# Patient Record
Sex: Male | Born: 1955 | Race: White | Hispanic: No | Marital: Single
Health system: Midwestern US, Community
[De-identification: ages and names within clinical notes are randomized; demographics above are authoritative.]

---

## 2011-07-09 ENCOUNTER — Ambulatory Visit: Payer: Self-pay | Admitting: Internal Medicine

## 2011-08-01 ENCOUNTER — Inpatient Hospital Stay: Payer: Self-pay | Admitting: Internal Medicine

## 2011-08-01 LAB — CBC
HCT: 25.2 % — ABNORMAL LOW (ref 40.0–52.0)
HGB: 8.4 g/dL — ABNORMAL LOW (ref 13.0–18.0)
Platelet: 281 10*3/uL (ref 150–440)
RBC: 3.05 10*6/uL — ABNORMAL LOW (ref 4.40–5.90)
RDW: 16.5 % — ABNORMAL HIGH (ref 11.5–14.5)
WBC: 20 10*3/uL — ABNORMAL HIGH (ref 3.8–10.6)

## 2011-08-01 LAB — URINALYSIS, COMPLETE
Bacteria: NONE SEEN
Specific Gravity: 1.013 (ref 1.003–1.030)
Squamous Epithelial: NONE SEEN
WBC UR: <1 /HPF (ref 0–5)

## 2011-08-01 LAB — COMPREHENSIVE METABOLIC PANEL
Albumin: 3.7 g/dL (ref 3.4–5.0)
Alkaline Phosphatase: 43 U/L — ABNORMAL LOW (ref 50–136)
Anion Gap: 23 — ABNORMAL HIGH (ref 7–16)
BUN: 63 mg/dL — ABNORMAL HIGH (ref 7–18)
Calcium, Total: 8.4 mg/dL — ABNORMAL LOW (ref 8.5–10.1)
Chloride: 112 mmol/L — ABNORMAL HIGH (ref 98–107)
EGFR (African American): 39 — ABNORMAL LOW
EGFR (Non-African Amer.): 32 — ABNORMAL LOW
Osmolality: 316 (ref 275–301)
Potassium: 3.6 mmol/L (ref 3.5–5.1)
Sodium: 148 mmol/L — ABNORMAL HIGH (ref 136–145)
Total Protein: 7.7 g/dL (ref 6.4–8.2)

## 2011-08-01 LAB — SALICYLATE LEVEL: Salicylates, Serum: <1.7 mg/dL

## 2011-08-01 LAB — TROPONIN I: Troponin-I: <0.02 ng/mL

## 2011-08-02 LAB — CBC WITH DIFFERENTIAL/PLATELET
Basophil #: 0.1 10*3/uL (ref 0.0–0.1)
Basophil %: 0.3 %
Eosinophil #: 0.1 10*3/uL (ref 0.0–0.7)
Eosinophil %: 0.3 %
HCT: 21.1 % — ABNORMAL LOW (ref 40.0–52.0)
HGB: 7 g/dL — ABNORMAL LOW (ref 13.0–18.0)
Lymphocyte #: 4.2 10*3/uL — ABNORMAL HIGH (ref 1.0–3.6)
Lymphocyte %: 22.8 %
MCHC: 33.2 g/dL (ref 32.0–36.0)
MCV: 84 fL (ref 80–100)
Monocyte #: 1.3 10*3/uL — ABNORMAL HIGH (ref 0.0–0.7)
Monocyte %: 6.9 %
RBC: 2.52 10*6/uL — ABNORMAL LOW (ref 4.40–5.90)
RDW: 16.2 % — ABNORMAL HIGH (ref 11.5–14.5)

## 2011-08-02 LAB — IRON AND TIBC
Iron Bind.Cap.(Total): 342 ug/dL (ref 250–450)
Unbound Iron-Bind.Cap.: 297 ug/dL

## 2011-08-02 LAB — COMPREHENSIVE METABOLIC PANEL
Albumin: 3.5 g/dL (ref 3.4–5.0)
Alkaline Phosphatase: 43 U/L — ABNORMAL LOW (ref 50–136)
Anion Gap: 15 (ref 7–16)
BUN: 49 mg/dL — ABNORMAL HIGH (ref 7–18)
Chloride: 120 mmol/L — ABNORMAL HIGH (ref 98–107)
Creatinine: 1.19 mg/dL (ref 0.60–1.30)
EGFR (African American): >60
EGFR (Non-African Amer.): 50 — ABNORMAL LOW
Glucose: 131 mg/dL — ABNORMAL HIGH (ref 65–99)
Potassium: 4.1 mmol/L (ref 3.5–5.1)
SGPT (ALT): 19 U/L
Total Protein: 7.2 g/dL (ref 6.4–8.2)

## 2011-08-02 LAB — TSH: Thyroid Stimulating Horm: 1.3 u[IU]/mL

## 2011-08-02 LAB — SEDIMENTATION RATE: Erythrocyte Sed Rate: 37 mm/hr — ABNORMAL HIGH (ref 0–20)

## 2011-08-02 LAB — AMMONIA: Ammonia, Plasma: 47 mcmol/L — ABNORMAL HIGH (ref 11–32)

## 2011-08-03 LAB — CBC WITH DIFFERENTIAL/PLATELET
HCT: 22.6 % — ABNORMAL LOW (ref 40.0–52.0)
Lymphocyte %: 23.5 %
MCH: 28.3 pg (ref 26.0–34.0)
MCHC: 32.9 g/dL (ref 32.0–36.0)
MCV: 86 fL (ref 80–100)
Monocyte %: 5.6 %
RDW: 16.4 % — ABNORMAL HIGH (ref 11.5–14.5)

## 2011-08-03 LAB — BASIC METABOLIC PANEL
BUN: 23 mg/dL — ABNORMAL HIGH (ref 7–18)
Calcium, Total: 7.8 mg/dL — ABNORMAL LOW (ref 8.5–10.1)
Chloride: 127 mmol/L — ABNORMAL HIGH (ref 98–107)
Creatinine: 0.9 mg/dL (ref 0.60–1.30)
EGFR (African American): >60
EGFR (Non-African Amer.): >60
Potassium: 3.6 mmol/L (ref 3.5–5.1)
Sodium: 159 mmol/L — ABNORMAL HIGH (ref 136–145)

## 2011-08-03 LAB — URINE CULTURE

## 2011-08-04 LAB — COMPREHENSIVE METABOLIC PANEL
Anion Gap: 12 (ref 7–16)
Bilirubin,Total: 0.4 mg/dL (ref 0.2–1.0)
Calcium, Total: 7.5 mg/dL — ABNORMAL LOW (ref 8.5–10.1)
Chloride: 123 mmol/L — ABNORMAL HIGH (ref 98–107)
EGFR (African American): >60
Sodium: 156 mmol/L — ABNORMAL HIGH (ref 136–145)
Total Protein: 6.1 g/dL — ABNORMAL LOW (ref 6.4–8.2)

## 2011-08-04 LAB — CBC WITH DIFFERENTIAL/PLATELET
Basophil #: 0 10*3/uL (ref 0.0–0.1)
Basophil %: 0.6 %
Eosinophil %: 3.8 %
HCT: 19 % — ABNORMAL LOW (ref 40.0–52.0)
HGB: 6.2 g/dL — ABNORMAL LOW (ref 13.0–18.0)
MCHC: 32.8 g/dL (ref 32.0–36.0)
Monocyte #: 0.3 10*3/uL (ref 0.0–0.7)
Monocyte %: 4.7 %
Neutrophil #: 3.9 10*3/uL (ref 1.4–6.5)
Neutrophil %: 55.6 %
Platelet: 91 10*3/uL — ABNORMAL LOW (ref 150–440)
RBC: 2.2 10*6/uL — ABNORMAL LOW (ref 4.40–5.90)
RDW: 16.8 % — ABNORMAL HIGH (ref 11.5–14.5)
WBC: 7 10*3/uL (ref 3.8–10.6)

## 2011-08-05 LAB — PROTIME-INR: Prothrombin Time: 15.5 secs — ABNORMAL HIGH (ref 11.5–14.7)

## 2011-08-05 LAB — CBC WITH DIFFERENTIAL/PLATELET
Bands: 1 %
Basophil %: 0.6 %
Eosinophil %: 3.7 %
Eosinophil: 2 %
HCT: 22.4 % — ABNORMAL LOW (ref 40.0–52.0)
Lymphocyte %: 36.9 %
MCH: 28.9 pg (ref 26.0–34.0)
MCV: 87 fL (ref 80–100)
Monocyte #: 0.3 10*3/uL (ref 0.0–0.7)
Monocytes: 6 %
Neutrophil #: 2.7 10*3/uL (ref 1.4–6.5)
Platelet: 88 10*3/uL — ABNORMAL LOW (ref 150–440)
RDW: 16.2 % — ABNORMAL HIGH (ref 11.5–14.5)
Segmented Neutrophils: 52 %

## 2011-08-05 LAB — BASIC METABOLIC PANEL
BUN: 7 mg/dL (ref 7–18)
Chloride: 123 mmol/L — ABNORMAL HIGH (ref 98–107)
Creatinine: 0.83 mg/dL (ref 0.60–1.30)
EGFR (African American): >60
EGFR (Non-African Amer.): >60
Glucose: 91 mg/dL (ref 65–99)
Osmolality: 301 (ref 275–301)
Sodium: 153 mmol/L — ABNORMAL HIGH (ref 136–145)

## 2011-08-05 LAB — APTT: Activated PTT: 33.8 secs (ref 23.6–35.9)

## 2011-08-05 LAB — FIBRINOGEN: Fibrinogen: 291 mg/dL (ref 210–470)

## 2011-08-05 LAB — FIBRIN DEGRADATION PROD.(ARMC ONLY): Fibrin Degradation Prod.: <10 (ref 2.1–7.7)

## 2011-08-06 LAB — BASIC METABOLIC PANEL
Anion Gap: 9 (ref 7–16)
BUN: 5 mg/dL — ABNORMAL LOW (ref 7–18)
Calcium, Total: 7.4 mg/dL — ABNORMAL LOW (ref 8.5–10.1)
Chloride: 115 mmol/L — ABNORMAL HIGH (ref 98–107)
Co2: 22 mmol/L (ref 21–32)
EGFR (African American): >60
Glucose: 89 mg/dL (ref 65–99)

## 2011-08-06 LAB — CBC WITH DIFFERENTIAL/PLATELET
Basophil %: 0.4 %
Eosinophil #: 0.2 10*3/uL (ref 0.0–0.7)
HCT: 22.7 % — ABNORMAL LOW (ref 40.0–52.0)
HGB: 7.5 g/dL — ABNORMAL LOW (ref 13.0–18.0)
Lymphocyte #: 1.6 10*3/uL (ref 1.0–3.6)
Lymphocyte %: 39.9 %
MCHC: 33 g/dL (ref 32.0–36.0)
Monocyte #: 0.2 10*3/uL (ref 0.0–0.7)
Monocyte %: 5.6 %
Neutrophil #: 2 10*3/uL (ref 1.4–6.5)
RBC: 2.6 10*6/uL — ABNORMAL LOW (ref 4.40–5.90)
RDW: 16.6 % — ABNORMAL HIGH (ref 11.5–14.5)
WBC: 4 10*3/uL (ref 3.8–10.6)

## 2011-08-07 LAB — CBC WITH DIFFERENTIAL/PLATELET
Basophil #: 0 10*3/uL (ref 0.0–0.1)
Eosinophil #: 0.1 10*3/uL (ref 0.0–0.7)
Eosinophil %: 4.3 %
HCT: 24.7 % — ABNORMAL LOW (ref 40.0–52.0)
MCHC: 33.7 g/dL (ref 32.0–36.0)
MCV: 87 fL (ref 80–100)
Monocyte #: 0.2 10*3/uL (ref 0.0–0.7)
Monocyte %: 5.7 %
Neutrophil %: 49.8 %
RBC: 2.86 10*6/uL — ABNORMAL LOW (ref 4.40–5.90)
RDW: 17 % — ABNORMAL HIGH (ref 11.5–14.5)
WBC: 3.5 10*3/uL — ABNORMAL LOW (ref 3.8–10.6)

## 2011-08-07 LAB — BASIC METABOLIC PANEL
BUN: 5 mg/dL — ABNORMAL LOW (ref 7–18)
Calcium, Total: 7.8 mg/dL — ABNORMAL LOW (ref 8.5–10.1)
Chloride: 117 mmol/L — ABNORMAL HIGH (ref 98–107)
Co2: 24 mmol/L (ref 21–32)
Creatinine: 0.8 mg/dL (ref 0.60–1.30)
EGFR (African American): >60
EGFR (Non-African Amer.): >60
Glucose: 87 mg/dL (ref 65–99)
Potassium: 3.6 mmol/L (ref 3.5–5.1)
Sodium: 150 mmol/L — ABNORMAL HIGH (ref 136–145)

## 2011-08-07 LAB — CULTURE, BLOOD (SINGLE)

## 2011-08-08 LAB — CBC WITH DIFFERENTIAL/PLATELET
Basophil #: 0 10*3/uL (ref 0.0–0.1)
Basophil %: 0.2 %
Eosinophil #: 0.2 10*3/uL (ref 0.0–0.7)
Eosinophil %: 4.9 %
HCT: 25.3 % — ABNORMAL LOW (ref 40.0–52.0)
HGB: 8.4 g/dL — ABNORMAL LOW (ref 13.0–18.0)
Lymphocyte #: 1.2 10*3/uL (ref 1.0–3.6)
MCH: 28.9 pg (ref 26.0–34.0)
MCV: 87 fL (ref 80–100)
Monocyte #: 0.2 10*3/uL (ref 0.0–0.7)
Monocyte %: 5.5 %
RDW: 16.4 % — ABNORMAL HIGH (ref 11.5–14.5)
WBC: 3.5 10*3/uL — ABNORMAL LOW (ref 3.8–10.6)

## 2011-08-08 LAB — BASIC METABOLIC PANEL
Anion Gap: 10 (ref 7–16)
Chloride: 115 mmol/L — ABNORMAL HIGH (ref 98–107)
Co2: 24 mmol/L (ref 21–32)
Creatinine: 0.82 mg/dL (ref 0.60–1.30)
Glucose: 85 mg/dL (ref 65–99)
Osmolality: 292 (ref 275–301)
Potassium: 3.6 mmol/L (ref 3.5–5.1)
Sodium: 149 mmol/L — ABNORMAL HIGH (ref 136–145)

## 2011-08-09 ENCOUNTER — Ambulatory Visit: Payer: Self-pay | Admitting: Internal Medicine

## 2011-08-23 ENCOUNTER — Ambulatory Visit: Payer: Self-pay | Admitting: Internal Medicine

## 2011-08-23 LAB — CBC CANCER CENTER
Basophil #: 0 x10 3/mm (ref 0.0–0.1)
Eosinophil #: 0.2 x10 3/mm (ref 0.0–0.7)
Eosinophil %: 6 %
HCT: 29.4 % — ABNORMAL LOW (ref 40.0–52.0)
HGB: 9.5 g/dL — ABNORMAL LOW (ref 13.0–18.0)
Lymphocyte #: 1.1 x10 3/mm (ref 1.0–3.6)
Lymphocyte %: 38.8 %
MCH: 28.1 pg (ref 26.0–34.0)
MCV: 87 fL (ref 80–100)
Monocyte #: 0.2 x10 3/mm (ref 0.2–1.0)
Neutrophil #: 1.4 x10 3/mm (ref 1.4–6.5)
RBC: 3.36 10*6/uL — ABNORMAL LOW (ref 4.40–5.90)
RDW: 17.7 % — ABNORMAL HIGH (ref 11.5–14.5)
WBC: 2.9 x10 3/mm — ABNORMAL LOW (ref 3.8–10.6)

## 2011-09-01 LAB — CBC CANCER CENTER
Bands: 1 %
Basophil #: 0 x10 3/mm (ref 0.0–0.1)
Eosinophil #: 0.2 x10 3/mm (ref 0.0–0.7)
HCT: 30.5 % — ABNORMAL LOW (ref 40.0–52.0)
HGB: 9.8 g/dL — ABNORMAL LOW (ref 13.0–18.0)
Lymphocyte #: 1.5 x10 3/mm (ref 1.0–3.6)
Lymphocyte %: 33.2 %
MCH: 27.6 pg (ref 26.0–34.0)
MCHC: 32.2 g/dL (ref 32.0–36.0)
MCV: 86 fL (ref 80–100)
Monocyte #: 0.3 x10 3/mm (ref 0.2–1.0)
Neutrophil #: 2.4 x10 3/mm (ref 1.4–6.5)
Platelet: 84 x10 3/mm — ABNORMAL LOW (ref 150–440)
RBC: 3.56 10*6/uL — ABNORMAL LOW (ref 4.40–5.90)
Segmented Neutrophils: 52 %

## 2011-09-01 LAB — BASIC METABOLIC PANEL
Anion Gap: 9 (ref 7–16)
BUN: 9 mg/dL (ref 7–18)
Calcium, Total: 8.2 mg/dL — ABNORMAL LOW (ref 8.5–10.1)
EGFR (African American): >60
EGFR (Non-African Amer.): >60
Glucose: 111 mg/dL — ABNORMAL HIGH (ref 65–99)
Osmolality: 283 (ref 275–301)

## 2011-09-02 LAB — CBC WITH DIFFERENTIAL/PLATELET
Basophil #: 0.1 10*3/uL (ref 0.0–0.1)
Basophil %: 1.1 %
Eosinophil #: 0.3 10*3/uL (ref 0.0–0.7)
Eosinophil %: 4 %
HCT: 24.9 % — ABNORMAL LOW (ref 40.0–52.0)
Lymphocyte %: 35.5 %
MCH: 28.7 pg (ref 26.0–34.0)
MCHC: 33.4 g/dL (ref 32.0–36.0)
MCV: 86 fL (ref 80–100)
Monocyte #: 0.4 x10 3/mm (ref 0.2–1.0)
Monocyte %: 6 %
Neutrophil #: 3.7 10*3/uL (ref 1.4–6.5)
Neutrophil %: 53.4 %
Platelet: 99 10*3/uL — ABNORMAL LOW (ref 150–440)
RBC: 2.9 10*6/uL — ABNORMAL LOW (ref 4.40–5.90)
RDW: 16.8 % — ABNORMAL HIGH (ref 11.5–14.5)

## 2011-09-02 LAB — URINALYSIS, COMPLETE
Bilirubin,UR: NEGATIVE
Glucose,UR: NEGATIVE mg/dL (ref 0–75)
Ketone: NEGATIVE
Leukocyte Esterase: NEGATIVE
Nitrite: NEGATIVE
Ph: 5 (ref 4.5–8.0)
Protein: NEGATIVE
WBC UR: NONE SEEN /HPF (ref 0–5)

## 2011-09-02 LAB — COMPREHENSIVE METABOLIC PANEL
Albumin: 3 g/dL — ABNORMAL LOW (ref 3.4–5.0)
Anion Gap: 13 (ref 7–16)
Bilirubin,Total: 0.7 mg/dL (ref 0.2–1.0)
Chloride: 111 mmol/L — ABNORMAL HIGH (ref 98–107)
Co2: 19 mmol/L — ABNORMAL LOW (ref 21–32)
EGFR (African American): >60
Glucose: 93 mg/dL (ref 65–99)
Osmolality: 291 (ref 275–301)
Potassium: 3.6 mmol/L (ref 3.5–5.1)
SGOT(AST): 25 U/L (ref 15–37)
SGPT (ALT): 15 U/L
Sodium: 143 mmol/L (ref 136–145)
Total Protein: 7 g/dL (ref 6.4–8.2)

## 2011-09-02 LAB — PROT IMMUNOELECTROPHORES(ARMC)

## 2011-09-02 LAB — TROPONIN I: Troponin-I: <0.02 ng/mL

## 2011-09-03 ENCOUNTER — Inpatient Hospital Stay: Payer: Self-pay | Admitting: Internal Medicine

## 2011-09-03 LAB — HEMOGLOBIN
HGB: 8.3 g/dL — ABNORMAL LOW (ref 13.0–18.0)
HGB: 8.7 g/dL — ABNORMAL LOW (ref 13.0–18.0)

## 2011-09-04 LAB — CBC WITH DIFFERENTIAL/PLATELET
Basophil %: 1 %
Eosinophil #: 0.1 10*3/uL (ref 0.0–0.7)
Eosinophil %: 5.1 %
HCT: 23.2 % — ABNORMAL LOW (ref 40.0–52.0)
Lymphocyte #: 1.2 10*3/uL (ref 1.0–3.6)
Lymphocyte %: 42.4 %
MCH: 29 pg (ref 26.0–34.0)
MCHC: 34.5 g/dL (ref 32.0–36.0)
MCV: 84 fL (ref 80–100)
Monocyte #: 0.2 x10 3/mm (ref 0.2–1.0)
Platelet: 62 10*3/uL — ABNORMAL LOW (ref 150–440)
RDW: 15.6 % — ABNORMAL HIGH (ref 11.5–14.5)
WBC: 2.9 10*3/uL — ABNORMAL LOW (ref 3.8–10.6)

## 2011-09-04 LAB — PROTIME-INR
INR: 1.2
Prothrombin Time: 15.9 secs — ABNORMAL HIGH (ref 11.5–14.7)

## 2011-09-04 LAB — COMPREHENSIVE METABOLIC PANEL
Albumin: 3.1 g/dL — ABNORMAL LOW (ref 3.4–5.0)
Alkaline Phosphatase: 54 U/L (ref 50–136)
Calcium, Total: 7.7 mg/dL — ABNORMAL LOW (ref 8.5–10.1)
Chloride: 111 mmol/L — ABNORMAL HIGH (ref 98–107)
Creatinine: 0.77 mg/dL (ref 0.60–1.30)
EGFR (Non-African Amer.): >60
Potassium: 3.3 mmol/L — ABNORMAL LOW (ref 3.5–5.1)
SGOT(AST): 26 U/L (ref 15–37)
SGPT (ALT): 15 U/L
Sodium: 143 mmol/L (ref 136–145)
Total Protein: 6.6 g/dL (ref 6.4–8.2)

## 2011-09-05 LAB — CBC WITH DIFFERENTIAL/PLATELET
Basophil #: 0 10*3/uL (ref 0.0–0.1)
Eosinophil #: 0.1 10*3/uL (ref 0.0–0.7)
Eosinophil %: 4.8 %
HCT: 27 % — ABNORMAL LOW (ref 40.0–52.0)
HGB: 9.2 g/dL — ABNORMAL LOW (ref 13.0–18.0)
MCHC: 34.2 g/dL (ref 32.0–36.0)
MCV: 84 fL (ref 80–100)
Monocyte #: 0.1 x10 3/mm — ABNORMAL LOW (ref 0.2–1.0)
Monocyte %: 6.3 %
RBC: 3.22 10*6/uL — ABNORMAL LOW (ref 4.40–5.90)
RDW: 16.3 % — ABNORMAL HIGH (ref 11.5–14.5)
WBC: 2.3 10*3/uL — ABNORMAL LOW (ref 3.8–10.6)

## 2011-09-05 LAB — BASIC METABOLIC PANEL
Anion Gap: 8 (ref 7–16)
BUN: 7 mg/dL (ref 7–18)
Calcium, Total: 7.9 mg/dL — ABNORMAL LOW (ref 8.5–10.1)
Chloride: 113 mmol/L — ABNORMAL HIGH (ref 98–107)
Creatinine: 0.75 mg/dL (ref 0.60–1.30)
EGFR (Non-African Amer.): >60
Osmolality: 284 (ref 275–301)
Potassium: 3.5 mmol/L (ref 3.5–5.1)

## 2011-09-06 LAB — BASIC METABOLIC PANEL
Anion Gap: 11 (ref 7–16)
Calcium, Total: 7.6 mg/dL — ABNORMAL LOW (ref 8.5–10.1)
Chloride: 110 mmol/L — ABNORMAL HIGH (ref 98–107)
Co2: 21 mmol/L (ref 21–32)
EGFR (African American): >60
EGFR (Non-African Amer.): >60
Glucose: 89 mg/dL (ref 65–99)
Osmolality: 280 (ref 275–301)
Sodium: 142 mmol/L (ref 136–145)

## 2011-09-06 LAB — CBC WITH DIFFERENTIAL/PLATELET
Basophil %: 0.6 %
Eosinophil %: 4.2 %
HGB: 9.1 g/dL — ABNORMAL LOW (ref 13.0–18.0)
Lymphocyte %: 39.5 %
MCH: 28.8 pg (ref 26.0–34.0)
MCHC: 34 g/dL (ref 32.0–36.0)
MCV: 85 fL (ref 80–100)
Monocyte #: 0.2 x10 3/mm (ref 0.2–1.0)
Monocyte %: 6.6 %
Neutrophil #: 1.3 10*3/uL — ABNORMAL LOW (ref 1.4–6.5)
Neutrophil %: 49.1 %
Platelet: 68 10*3/uL — ABNORMAL LOW (ref 150–440)
RDW: 16.1 % — ABNORMAL HIGH (ref 11.5–14.5)
WBC: 2.7 10*3/uL — ABNORMAL LOW (ref 3.8–10.6)

## 2011-09-07 LAB — CBC WITH DIFFERENTIAL/PLATELET
Basophil #: 0 10*3/uL (ref 0.0–0.1)
Eosinophil #: 0.2 10*3/uL (ref 0.0–0.7)
Eosinophil %: 5.3 %
HCT: 26.5 % — ABNORMAL LOW (ref 40.0–52.0)
HGB: 9.1 g/dL — ABNORMAL LOW (ref 13.0–18.0)
Lymphocyte #: 1.2 10*3/uL (ref 1.0–3.6)
Lymphocyte %: 36.3 %
MCHC: 34.5 g/dL (ref 32.0–36.0)
Monocyte %: 5.8 %
Neutrophil #: 1.7 10*3/uL (ref 1.4–6.5)
Platelet: 78 10*3/uL — ABNORMAL LOW (ref 150–440)
RBC: 3.14 10*6/uL — ABNORMAL LOW (ref 4.40–5.90)
RDW: 16.1 % — ABNORMAL HIGH (ref 11.5–14.5)
WBC: 3.2 10*3/uL — ABNORMAL LOW (ref 3.8–10.6)

## 2011-09-08 ENCOUNTER — Ambulatory Visit: Payer: Self-pay | Admitting: Internal Medicine

## 2011-10-09 ENCOUNTER — Ambulatory Visit: Payer: Self-pay | Admitting: Internal Medicine

## 2011-10-14 ENCOUNTER — Ambulatory Visit: Payer: Self-pay | Admitting: Internal Medicine

## 2011-11-01 ENCOUNTER — Ambulatory Visit: Payer: Self-pay | Admitting: Unknown Physician Specialty

## 2011-11-01 LAB — PROTIME-INR
INR: 1.3
Prothrombin Time: 16.3 secs — ABNORMAL HIGH (ref 11.5–14.7)

## 2011-11-01 LAB — PLATELET COUNT: Platelet: 84 10*3/uL — ABNORMAL LOW (ref 150–440)

## 2011-11-01 LAB — APTT: Activated PTT: 39.5 secs — ABNORMAL HIGH (ref 23.6–35.9)

## 2011-11-19 ENCOUNTER — Emergency Department: Payer: Self-pay | Admitting: Emergency Medicine

## 2011-11-19 LAB — BASIC METABOLIC PANEL
Anion Gap: 9 (ref 7–16)
BUN: 8 mg/dL (ref 7–18)
Calcium, Total: 8.9 mg/dL (ref 8.5–10.1)
Chloride: 106 mmol/L (ref 98–107)
Creatinine: 0.86 mg/dL (ref 0.60–1.30)
EGFR (African American): >60
Glucose: 91 mg/dL (ref 65–99)
Osmolality: 277 (ref 275–301)
Potassium: 3.6 mmol/L (ref 3.5–5.1)
Sodium: 140 mmol/L (ref 136–145)

## 2011-11-19 LAB — CBC WITH DIFFERENTIAL/PLATELET
Basophil %: 0.6 %
Eosinophil #: 0.2 10*3/uL (ref 0.0–0.7)
Eosinophil %: 3 %
HGB: 11.9 g/dL — ABNORMAL LOW (ref 13.0–18.0)
Lymphocyte #: 1 10*3/uL (ref 1.0–3.6)
MCH: 28.6 pg (ref 26.0–34.0)
MCV: 83 fL (ref 80–100)
Monocyte #: 0.3 x10 3/mm (ref 0.2–1.0)
Monocyte %: 6.2 %
Neutrophil %: 71.3 %
RBC: 4.15 10*6/uL — ABNORMAL LOW (ref 4.40–5.90)
RDW: 15.9 % — ABNORMAL HIGH (ref 11.5–14.5)
WBC: 5 10*3/uL (ref 3.8–10.6)

## 2011-11-21 LAB — BETA STREP CULTURE(ARMC)

## 2012-05-21 ENCOUNTER — Ambulatory Visit: Payer: Self-pay | Admitting: Internal Medicine

## 2012-06-15 ENCOUNTER — Ambulatory Visit: Payer: Self-pay | Admitting: Internal Medicine

## 2013-06-22 IMAGING — CT CT HEAD WITHOUT CONTRAST
2 of 3 series · 16 of 30 positions shown, 19 images · non-contrast
Comparison: none

REASON FOR EXAM: ams
COMMENTS:

[Series 2: without · axial · non-contrast · 0.45mm/px · z∈[+247,+377]mm · 9 of 34 slices shown, 12 images (1 of 2)]
[im 4/34  brain]
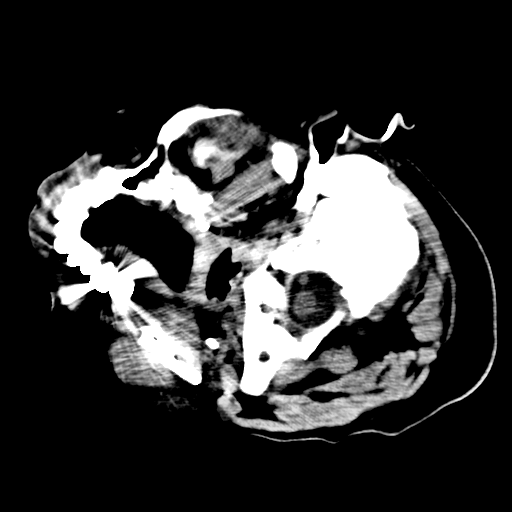
[im 4/34  bone]
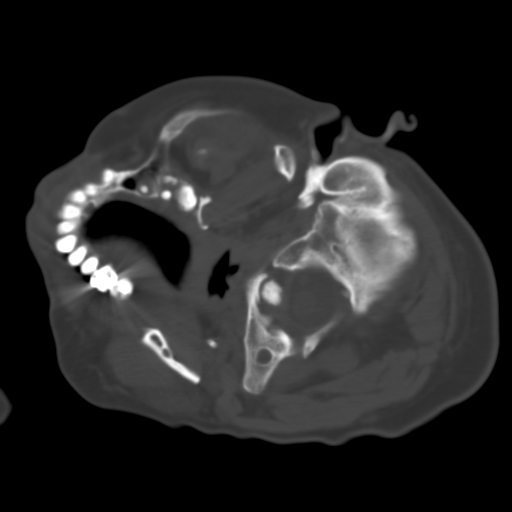
[im 7/34  brain]
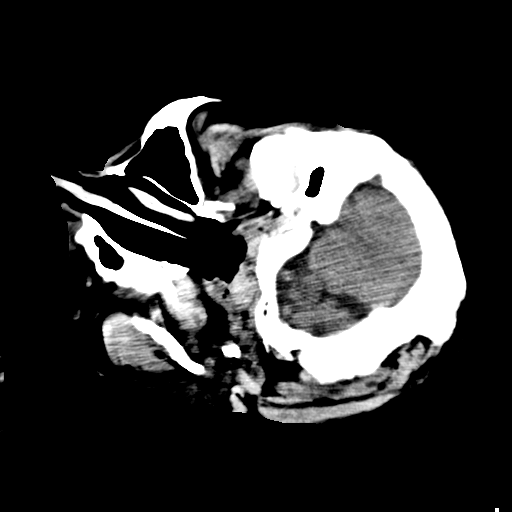
[im 10/34  brain]
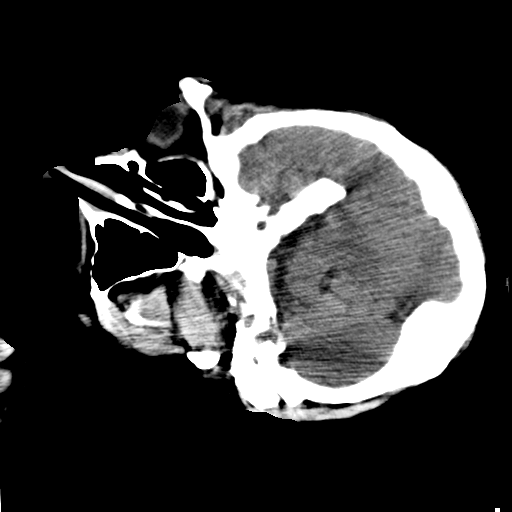
[im 14/34  brain]
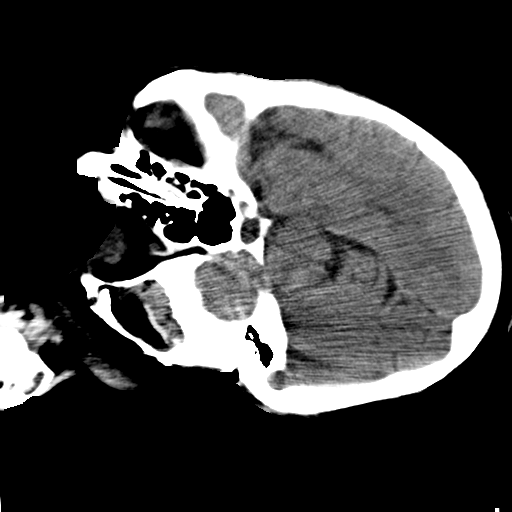
[im 17/34  brain]
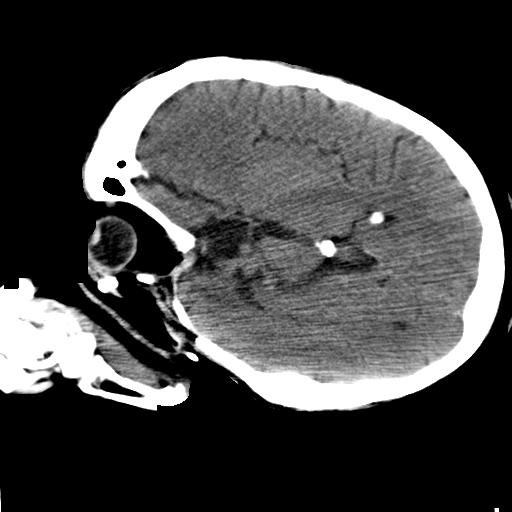
[im 17/34  bone]
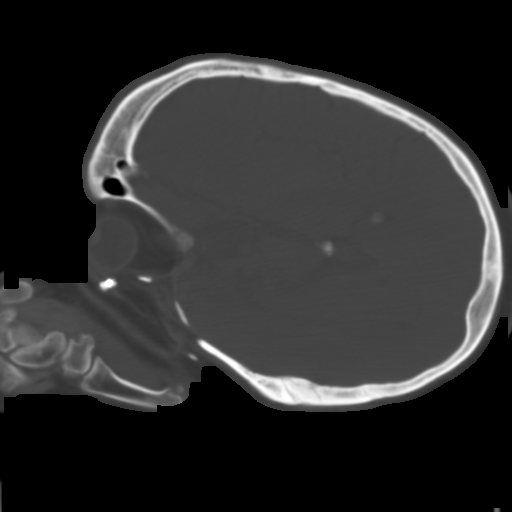
[im 20/34  brain]
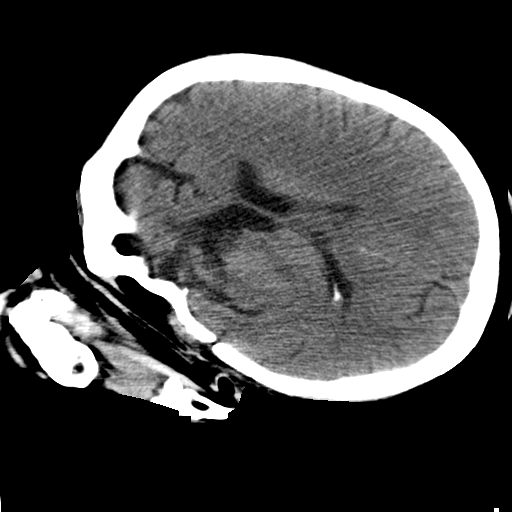
[im 24/34  brain]
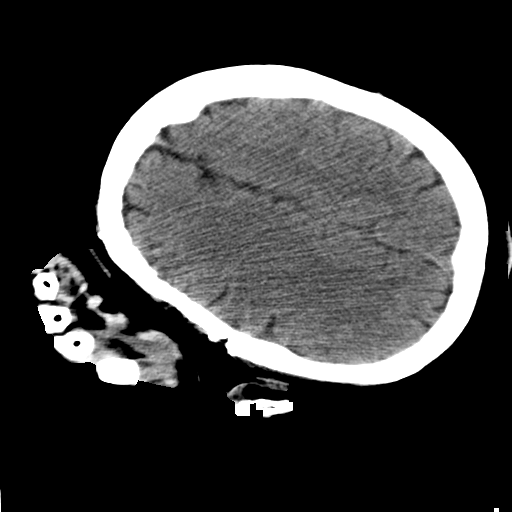
[im 27/34  brain]
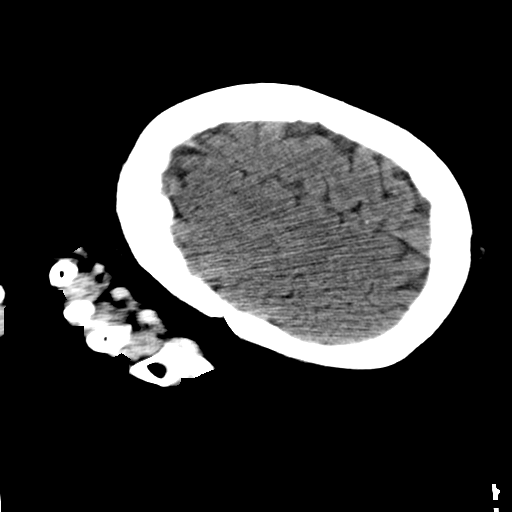
[im 30/34  brain]
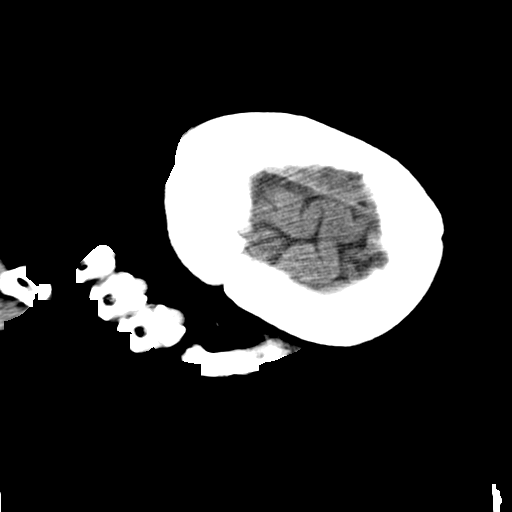
[im 30/34  bone]
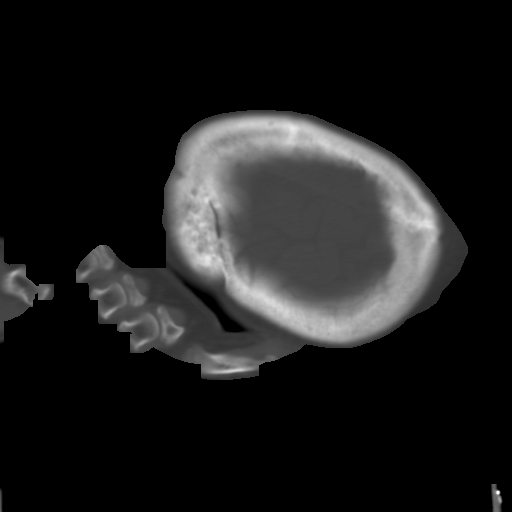

[Series 4: without · axial · non-contrast · 0.45mm/px · z∈[+306,+414]mm · 7 of 31 slices shown (2 of 2)]
[im 4/31  brain]
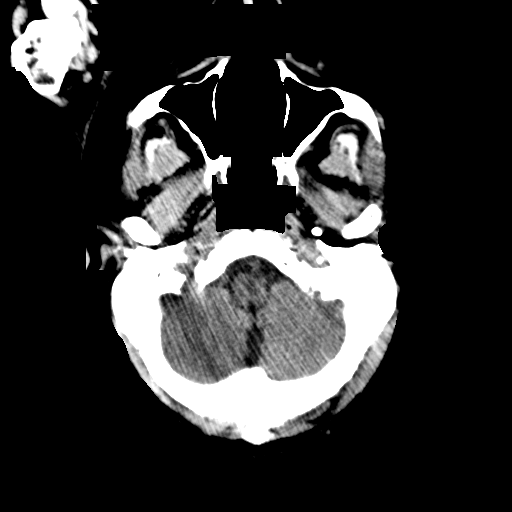
[im 8/31  brain]
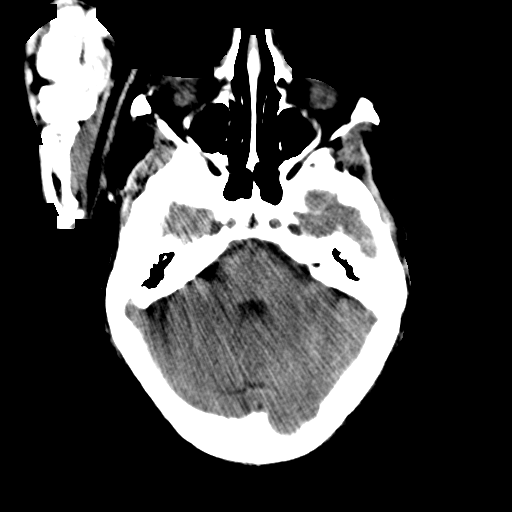
[im 12/31  brain]
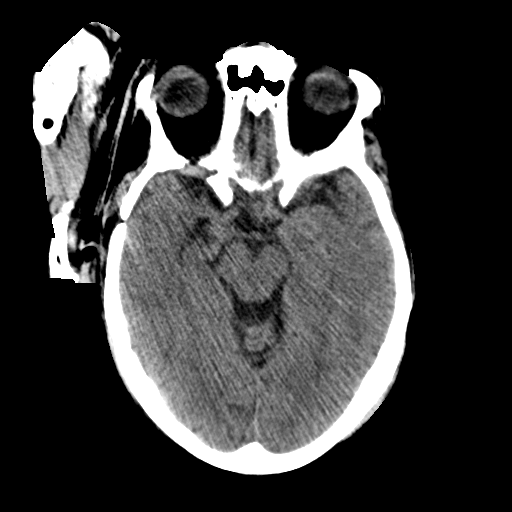
[im 16/31  brain]
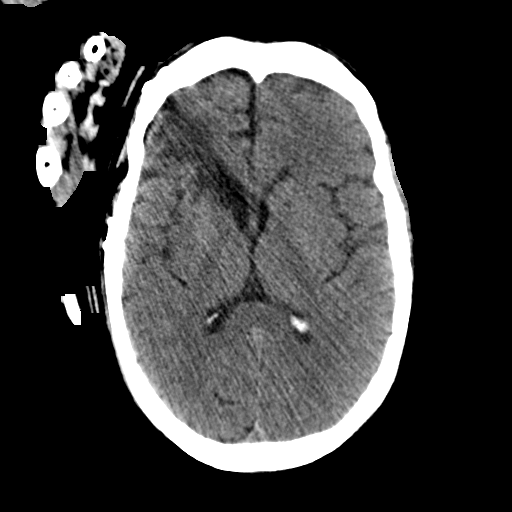
[im 19/31  brain]
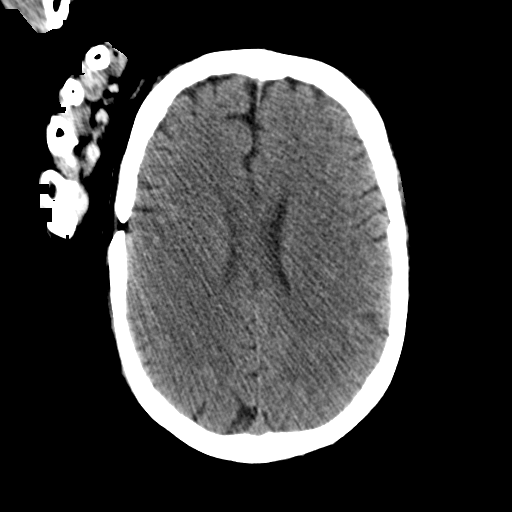
[im 23/31  brain]
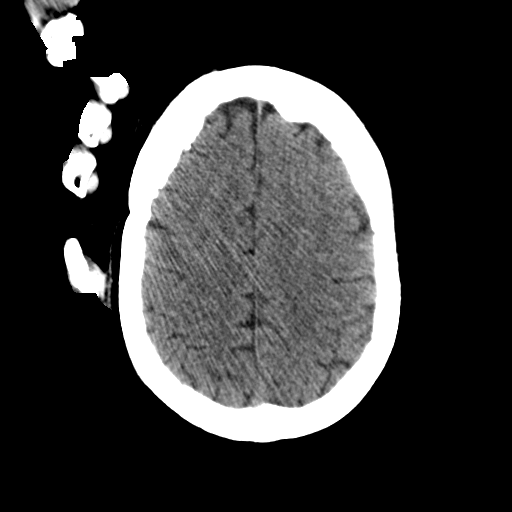
[im 27/31  brain]
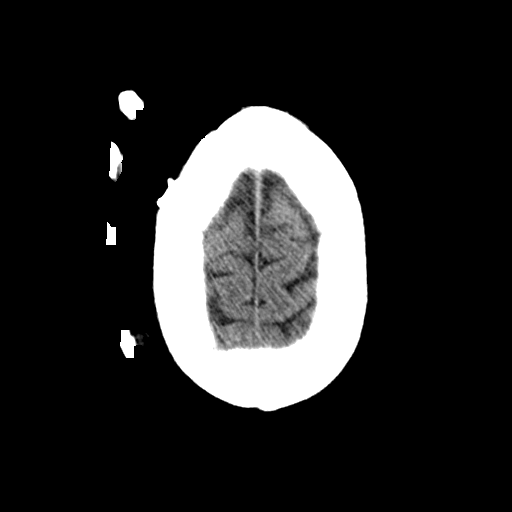

[16 of 30 positions shown; findings below may reference images not displayed]

PROCEDURE:     CT  - CT HEAD WITHOUT CONTRAST  - August 01, 2011  [DATE]

RESULT:     Axial imaging was performed through the brain with the patient's
head turned toward the right. Reconstructions were obtained to allow the
patient to be appropriately positioned.

The ventricles are normal in size and position. There is encephalomalacia
involving the anteromedial aspect of the right temporal lobe and the
inferior aspect of the right frontal lobe. There is no shift of the midline.
There is mild ex vacuo dilation the frontal horn of the right lateral
ventricle. There is no evidence of an acute intracranial hemorrhage nor of
an acute evolving ischemic infarction. The cerebellum and brainstem are
grossly normal.

At bone window settings the observed portions of the paranasal sinuses and
mastoid air cells are clear. There is evidence of previous craniectomy in
the right frontal and temporal regions.
IMPRESSION: 1. I do not see evidence of an acute ischemic or hemorrhagic event. There is
no intracranial mass effect nor hydrocephalus.
2. There are chronic changes in the right frontal and temporal lobes and
evidence of previous surgery in this region.

## 2013-06-22 IMAGING — CR DG CHEST 1V PORT
1 series · 1 of 1 positions shown · non-contrast
Comparison: none

REASON FOR EXAM: ams
COMMENTS:

[portable]
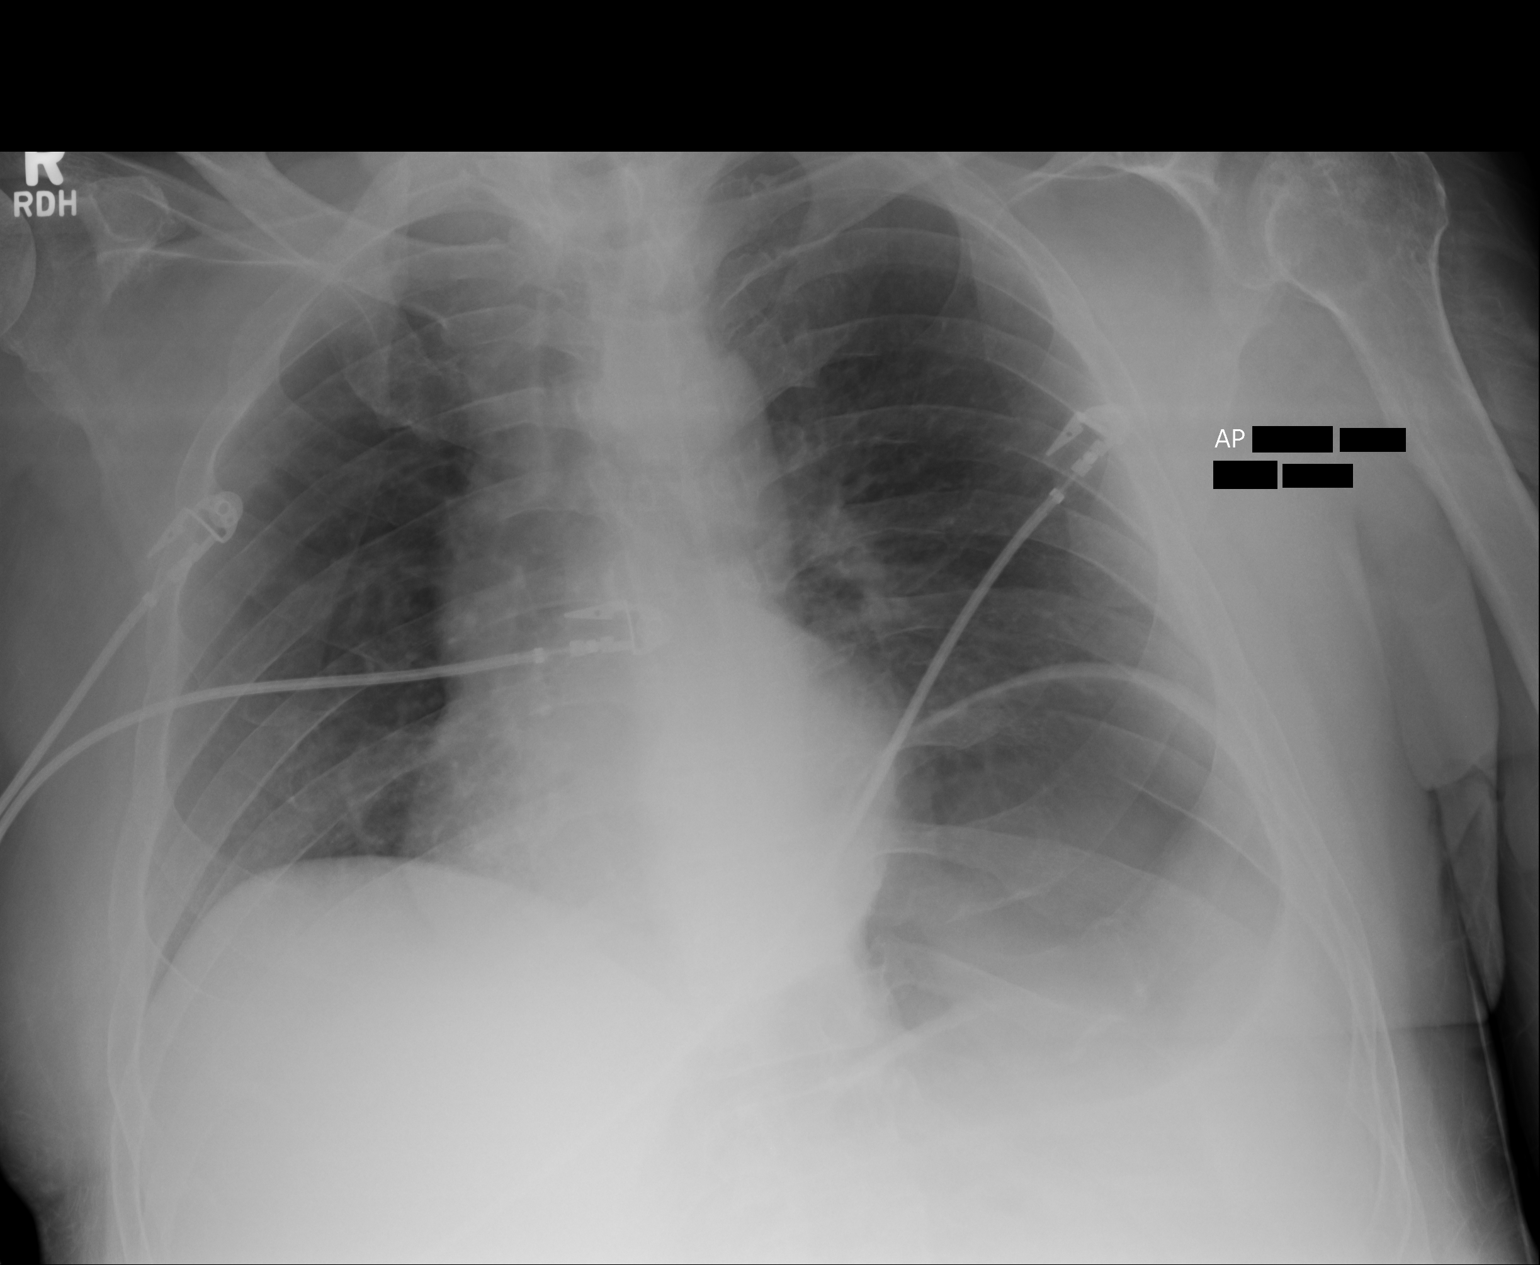

[1 of 1 positions shown; findings below may reference images not displayed]

PROCEDURE:     DXR - DXR PORTABLE CHEST SINGLE VIEW  - August 01, 2011  [DATE]

RESULT:     The lung fields are clear. No pneumonia, pneumothorax or pleural
effusion is seen. Heart size is normal. Monitoring electrodes are present.
Incidental note is made of moderate gaseous distention of the stomach.
IMPRESSION: 1.     No acute changes are identified.

## 2013-11-14 ENCOUNTER — Ambulatory Visit: Payer: Self-pay | Admitting: Internal Medicine

## 2013-11-22 ENCOUNTER — Ambulatory Visit: Payer: Self-pay | Admitting: Internal Medicine

## 2013-11-22 LAB — CBC CANCER CENTER
Comment - H1-Com2: NORMAL
Eosinophil: 2 %
HCT: 34.5 % — ABNORMAL LOW (ref 40.0–52.0)
HGB: 11.4 g/dL — AB (ref 13.0–18.0)
LYMPHS PCT: 25 %
MCH: 28.8 pg (ref 26.0–34.0)
MCHC: 33 g/dL (ref 32.0–36.0)
MCV: 87 fL (ref 80–100)
Monocytes: 5 %
Platelet: 101 x10 3/mm — ABNORMAL LOW (ref 150–440)
RBC: 3.96 10*6/uL — ABNORMAL LOW (ref 4.40–5.90)
RDW: 15.2 % — AB (ref 11.5–14.5)
Segmented Neutrophils: 68 %
WBC: 5 x10 3/mm (ref 3.8–10.6)

## 2013-11-22 LAB — IRON AND TIBC
IRON BIND. CAP.(TOTAL): 423 ug/dL (ref 250–450)
Iron Saturation: 12 %
Iron: 50 ug/dL — ABNORMAL LOW (ref 65–175)
Unbound Iron-Bind.Cap.: 373 ug/dL

## 2013-11-22 LAB — PROTIME-INR
INR: 1.2
Prothrombin Time: 14.8 secs — ABNORMAL HIGH (ref 11.5–14.7)

## 2013-11-22 LAB — FERRITIN: Ferritin (ARMC): 32 ng/mL (ref 8–388)

## 2013-11-22 LAB — LACTATE DEHYDROGENASE: LDH: 182 U/L (ref 85–241)

## 2013-11-22 LAB — RETICULOCYTES
ABSOLUTE RETIC COUNT: 0.0777 10*6/uL (ref 0.019–0.186)
Reticulocyte: 1.96 % (ref 0.4–3.1)

## 2013-11-22 LAB — APTT: Activated PTT: 35.2 secs (ref 23.6–35.9)

## 2013-11-22 LAB — FIBRINOGEN: FIBRINOGEN: 311 mg/dL (ref 210–470)

## 2013-11-22 LAB — FOLATE: FOLIC ACID: 39.5 ng/mL (ref 3.1–100.0)

## 2013-11-26 LAB — PROT IMMUNOELECTROPHORES(ARMC)

## 2013-12-08 ENCOUNTER — Ambulatory Visit: Payer: Self-pay | Admitting: Internal Medicine

## 2014-01-21 ENCOUNTER — Ambulatory Visit: Payer: Self-pay | Admitting: Unknown Physician Specialty

## 2014-09-01 NOTE — Consult Note (Signed)
Chief Complaint:   Subjective/Chief Complaint Patient seen and examined, please see full GI consult.  Patient admitted with ams.   Noted with anemia, "large black bm" yesterday.  Pateitn with elevated ammonia. Elevated ammonia could be related to this.  Currently mental status is improved, patient appropriate and answering questions.  H/O GERD 25 years ago, not om antireflux meds recently, minimal nsaid use.   Currently with drop of platelets, Hematology consulted and following, question of hits versus antibiotic effect versus consumptive, versus multifactorial.  Agree with EGDand colonoscopy when clinically fe4asible, awaiting improvement of platelets at this point.  I will not be available until monday,  Dr Mechele CollinElliott will be following over the weekend.  Recommend EGD on monday followed by colonoscopy the next day or as outpatient once the stomach is verified to have no bleeding lesion that could be exacerbated by large colon prep.   VITAL SIGNS/ANCILLARY NOTES: **Vital Signs.:   28-Mar-13 17:04   Vital Signs Type Q 4hr   Temperature Temperature (F) 97.2   Celsius 36.2   Temperature Source axillary   Pulse Pulse 65   Respirations Respirations 20   Systolic BP Systolic BP 110   Diastolic BP (mmHg) Diastolic BP (mmHg) 66   Mean BP 80   Pulse Ox % Pulse Ox % 95   Pulse Ox Activity Level  At rest   Oxygen Delivery Room Air/ 21 %   Electronic Signatures: Barnetta ChapelSkulskie, Martin (MD)  (Signed 28-Mar-13 18:50)  Authored: Chief Complaint, VITAL SIGNS/ANCILLARY NOTES   Last Updated: 28-Mar-13 18:50 by Barnetta ChapelSkulskie, Martin (MD)

## 2014-09-01 NOTE — Consult Note (Signed)
PATIENT NAME:  Duane Young, COLLEGE MR#:  [id number] DATE OF BIRTH:  [birth year]  DATE OF CONSULTATION:  09/03/2011  REFERRING PHYSICIAN:  Alounthith Phichith, MD CONSULTING PHYSICIAN:  Nimrit Kehres R. Ma Hillock, MD  REASON FOR CONSULTATION: Acute on chronic anemia status post bone marrow biopsy on 09/02/2011.  HISTORY OF PRESENT ILLNESS: The patient is a 59 year old gentleman with past medical history significant for brain tumor resected in [place], [place] (according to history provided by patient and mother, this was about 10 years ago and he had surgery and radiation, unsure of histology but states he has not had recurrence since then) who recently had been hospitalized for weakness and anemia. He also was found to have thrombocytopenia. Hematology consult was done at that time and work-up did not reveal obvious etiology other than splenomegaly on ultrasound, mild iron deficiency with serum iron of 45 and iron saturation 13%, serum ferritin was in normal range at 19. B12 and folate were unremarkable. The patient does not have any renal insufficiency. The patient therefore was seen for follow-up on 09/01/2011 at which time hemoglobin was 9.8, platelet count 84,000, and bone marrow biopsy was done to rule out underlying myelodysplasia or other marrow disorder like lymphoma given splenomegaly. The patient was admitted to the hospital earlier today for complaints of progressive weakness and hemoglobin had dropped down to 8.3, he is getting 2 units packed red blood cell transfusion. Clinically beginning to feel better.   PAST MEDICAL HISTORY/PAST SURGICAL HISTORY: As in history of present illness. In addition: 1. Anemia of chronic disease and pancytopenia.  2. Chronic constipation.  3. SIADH by history.   FAMILY HISTORY: Noncontributory.   SOCIAL HISTORY: Denies history of smoking, alcohol, or recreational drug usage. He resides in a group home, currently has been staying with his mother for the past few days.    ALLERGIES: No known drug allergies.    MEDICATIONS: 1. Seroquel 25 mg in the a.m. p.r.n. 2. Lactulose 30 mL every six hours p.r.n. 3. Nadolol 20 mg daily.  4. Protonix 40 mg daily. 5. Ferrous sulfate 325 mg twice a day. 6. Senna 1 tablet twice a day. 7. Tylenol 650 mg every four hours p.r.n.   REVIEW OF SYSTEMS: CONSTITUTIONAL: Generalized weakness and fatigue. Denies fevers or chills. No night sweats. HEENT: No new headaches, dizziness, epistaxis, ear or jaw pain. CARDIAC: No angina, orthopnea, palpitations, or paroxysmal nocturnal dyspnea. LUNGS: Mild dyspnea on exertion only. No cough, sputum, hemoptysis, or chest pain. GASTROINTESTINAL: No nausea or vomiting. No bright red blood in stools or melena. GENITOURINARY: No dysuria or hematuria. MUSCULOSKELETAL: No new bone pains. Has chronic back pain which is mild and unchanged. SKIN: No new rashes or pruritus. HEMATOLOGIC: Denies obvious bleeding symptoms including blood in stools or urine. NEUROLOGIC: No new focal weakness, seizures, or loss of consciousness. ENDOCRINE: No polyuria or polydipsia. Appetite is steady.   PHYSICAL EXAMINATION:   GENERAL: The patient is a moderately built well-nourished individual, sitting in recliner chair,  alert and oriented to self, place, person, and time and converses appropriately. No icterus. Pallor present.   VITAL SIGNS: 97.9, 76, respirations 18, 95/60, 95% on room air.   HEENT: Normocephalic, atraumatic. Extraocular movements intact. Sclera anicteric. No oral thrush.   NECK: Supple without lymphadenopathy.   HEART: S1 and S2 regular rate and rhythm.   LUNGS: Bilateral good air entry, no crepitations or rhonchi.   ABDOMEN: Soft and nontender, spleen just palpable.   EXTREMITIES: No major edema or cyanosis.   SKIN: No generalized  rashes or major bruising.   NEUROLOGIC: Cranial nerves are intact, moves all extremities spontaneously.   MUSCULOSKELETAL: No obvious joint deformity or  swelling.   LAB RESULTS: Hemoglobin 8.3, platelets 99, MCV 86, WBC 7000, and ANC 3700. Creatinine 0.73 and calcium 7.7. Liver functions unremarkable, except albumin of 3.0.   IMPRESSION AND RECOMMENDATIONS: A 59 year old gentleman who has been having chronic anemia along with recently diagnosed progressive thrombocytopenia and splenomegaly of untreated etiology (underlying occult chronic liver disease with splenomegaly/hypersplenism versus other etiology) who just had bone marrow biopsy on 09/01/2011 at which time hemoglobin was 9.8. He has been admitted with worsening anemia with hemoglobin closer to 8 grams and progressive fatigue. The patient is beginning to feel better after packed red blood cell transfusion support. Recommend continued supportive transfusion as indicated by labs and symptoms. Continue to look for bleeding, currently no obvious bleeding symptoms clinically. He has had extensive work-up. Bone marrow biopsy report is pending at this time, will see if we can obtain the preliminary report on this as soon as possible. Continue to monitor CBC daily and transfuse packed red blood cells as indicated by labs and symptoms, also monitor platelets and consider platelet transfusion if it drops less than 20K or higher counts if any major bleeding symptoms. Otherwise no other new recommendations at this time. Hematology will continue to follow on an as needed basis during hospitalization. Otherwise the patient is advised to keep his scheduled outpatient appointments, same as previously scheduled. The patient and his mother, who was present, were explained above, agreeable to this plan.   Thank you for the referral. Please feel free to contact me if additional questions. ____________________________ Rhett Bannister Ma Hillock, MD srp:slb D: 09/03/2011 14:56:41 ET     T: 09/03/2011 15:32:05 ET        JOB#: [id number] cc: Melessa Cowell R. Ma Hillock, MD, <Dictator> Alveta Heimlich MD ELECTRONICALLY SIGNED 09/04/2011  21:58

## 2014-09-01 NOTE — Consult Note (Signed)
PATIENT NAME:  Duane Young, Duane Young MR#:  [id number] DATE OF BIRTH:  20-May-1955  DATE OF CONSULTATION:  08/02/2011  REFERRING PHYSICIAN:  Fulton Reek, MD  CONSULTING PHYSICIAN:  Mila Homer. Tamala Julian, MD  REASON FOR CONSULTATION: Altered mental status.  HISTORY OF PRESENT ILLNESS: This is a 59 year old right hand dominant Caucasian male with cognitive impairment secondary to a tumor resection 10 years prior who is a nursing home resident who presents with lethargy over the last two days. Apparently he has not been eating or drinking for the last couple of days. His mother is at the bedside. She noted he started acting abnormal Thursday and he suddenly started slowly declining since that Thursday to the last two days where he has not been eating or drinking. She states that normally he communicates and is able to do most of his activities of daily living even though he is in a nursing home and prior to the surgery 10 years ago he was completely normal.   PAST MEDICAL HISTORY: 1. Anemia of chronic disease. 2. Chronic constipation. 3. Ataxia. 4. Syndrome and inappropriate ADH secretion. 5. Some brain tumor/possible aneurysm. History is vague on that. 6. Chronic cognitive impairment.  PAST SURGICAL HISTORY: Left craniotomy for surgical removal.  MEDICATIONS OUTSIDE: 1. Senna. 2. Aspirin.  MEDICATIONS INPATIENT: 1. Senna. 2. Aspirin. 3. Levaquin. 4. Lorazepam. 5. Zofran. 6. Protonix.  ALLERGIES: No known drug allergies.  FAMILY HISTORY: Significant for aneurysms.  SOCIAL HISTORY: No alcohol. No tobacco. No illicits. He lives in a group home.  REVIEW OF SYSTEMS: Unobtainable secondary to mental status.  PHYSICAL EXAMINATION:   VITAL SIGNS: Temperature 98.2, blood pressure 106/72, pulse 104, respirations 18, sating 99% on 2 liters.  GENERAL: He is slightly disheveled looking, looks irritated but appropriately groomed.  HEENT: He has a left craniotomy scar, otherwise normocephalic.  Sclerae nonicteric. Oropharynx is clear except for some blood.  NECK: Supple. There is no JVD. No bruits.  CHEST: Clear to auscultation even though he is slightly tachypneic. Moderate coarse breath sounds.   CARDIOVASCULAR: He is tachycardic. No murmurs.  ABDOMEN: Soft, nontender, and nondistended.  EXTREMITIES: No cyanosis, clubbing, or edema.  SKIN: Maculopapular rash on his arms only.  NEUROLOGIC: He is obtunded. He does not follow. He briskly localizes. He mumbles to pain. He does not open to voice but does to pain. His GCS is a 9.   CRANIAL NERVES: Pupils are 5 mm bilateral and reactive. He has normal doll's eyes and cornea. He has good gag. His face appears symmetric.  MOTOR: He moves all four extremities equally. There is normal tone. He localizes slowly to pain but equally.   CEREBELLAR: Unable to be performed. Reflexes are 1+ symmetric. Bilateral plantars are upgoing.   SENSORY: He reacts to pain as above.  CT was personally reviewed by me showing left frontal craniotomy scar with some encephalomalacia there. There is no sign of new infarct.  Labs are reviewed. He is noted to have a white count of 20 on admission, also noted to have a sodium of 148, a creatinine of 1.76 and BUN of 63.   IMPRESSION: 1. Encephalopathy. This appears to be metabolic in nature. Hypernatremia can cause confustion.  His LFTs are normal, however, he could still have increased ammonia. We know his BUN is elevated at 63 and is doing down which is most likely culprit. There is currently no sign of infection nor any signs that look like this is meningitis. There is no fluctuations to make [place] think  of seizures either. Agree with checking B12, ammonia, and CRP to make sure this is not infectious in origin. 2. Leukocytosis. Etiology is most likely secondary to dehydration due to lack of fever. 3. Syndrome and inappropriate ADH secretion. There is no current sign that the patient is hypernatremic now.    PLAN: 1. Check CRP, ammonia level, and B12. 2. Continue fluid challenge and see how the patient resolves. 3. Look for any sign of infection even though we doubt with close to normal ESR. 4.  We will watch the patient with you and see if he gradually improves. If he is not improving or if there is a fever, we will consider doing a lumbar puncture or if there are fluctuations we will consider doing an EEG. However, we do not think either one of these will occur.  Thank you for the consultation. We will continue to follow the patient with you.   ____________________________ Mila Homer. Tamala Julian, MD mcs:drc D: 08/02/2011 13:08:41 ET T: 08/02/2011 13:29:27 ET JOB#: [id number]  cc: Mila Homer Tamala Julian, MD, <Dictator> Jamison Neighbor MD ELECTRONICALLY SIGNED 08/03/2011 9:10

## 2014-09-01 NOTE — Consult Note (Signed)
PATIENT NAME:  Duane Young, BAHRI MR#:  [id number] DATE OF BIRTH:  [birth year]  DATE OF CONSULTATION:  09/03/2011  REFERRING PHYSICIAN:  Fulton Reek, MD CONSULTING PHYSICIAN:  Jill Side, MD  REASON FOR CONSULTATION: Symptomatic anemia.   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 The patient had a bone marrow biopsy yesterday. The patient has been feeling weak and has been anemic with a baseline hemoglobin of around 9.5. The patient started to feel weak after bone marrow biopsy and came to the hospital where the hemoglobin was 8.3. He denies any abnormal bleeding, melena, bright red blood per rectum, nausea, vomiting, or hematemesis. He has received 2 units of packed RBCs and feels somewhat better. The patient had an upper GI endoscopy by Dr. Vira Agar last month which showed grade II esophageal varices as well as portal hypertensive gastropathy. The patient has never had a colonoscopy. Bone marrow biopsy results are not available.   PAST MEDICAL HISTORY:  1. As above.  2. History of chronic anemia and pancytopenia.  3. History of her chronic constipation. 4. History of SIADH.   ALLERGIES: No known drug allergies.  HOME MEDICATIONS: Tylenol, senna, Seroquel, lactulose, nadolol, Protonix, and ferrous sulfate.   SOCIAL HISTORY: He resides in a group home. He does not smoke or drink, but according to him he used to be a heavy beer drinker.   FAMILY HISTORY: Father with Parkinson's disease and brain tumor.  REVIEW OF SYSTEMS: Negative except for what is mentioned in the History of Present Illness.   PHYSICAL EXAMINATION:   GENERAL: The patient appears very pale. He does not appear to be in any acute distress.  VITALS: Pulse is about 75, respirations 18, and blood pressure 92/63. No jaundice was noted.   NECK: Veins are  flat.   LUNGS: Grossly clear to auscultation bilaterally with fair air entry and no added sounds.   HEART: Regular rate and rhythm. No gallops or murmur.   ABDOMEN: Fairly benign abdomen which is soft, nontender, and nondistended. No rebound or guarding was noted. No hepatosplenomegaly or ascites   EXTREMITIES: No edema.   NEUROLOGIC: Examination appears to be grossly unremarkable.   LABS/STUDIES: Hemoglobin is 8.7, white cell count 7, and platelet count 99. PT and INR was not done.   Chest x-ray is unremarkable.   Ultrasound of the abdomen showed what appears to be fatty infiltration of the liver as well as splenomegaly.   ASSESSMENT AND PLAN: The patient is with anemia with borderline iron saturation. He does have splenomegaly as well as pancytopenia. Upper GI endoscopy showed grade II esophageal varices and portal hypertensive gastropathy raising concerns about underlying portal hypertension. Most likely the patient has chronic liver disease or cirrhosis. This is most likely secondary to his use of alcohol in the past, and other liver diseases are possible as well. There are no signs of active gastrointestinal blood loss. The patient was heme negative on admission. Chronic GI blood loss is secondary to portal hypertensive gastropathy most likely, as mentioned above, although colonic lesions such as colon polyps cannot be completely excluded as well. I would recommend transfusing him further if needed. Follow hemoglobin and hematocrit. Agree    with continuing nadolol and PPI. The patient will require a colonoscopy, which we will plan on performing as an outpatient. I will obtain hepatitis serologies to rule out other kinds  of liver disease. There are no signs of active GI bleeding and we will continue to observe him closely.  ____________________________ Jill Side, MD si:slb D: 09/03/2011 17:33:32 ET T: 09/04/2011 10:09:24 ET JOB#: [id number]  cc: Jill Side, MD,  <Dictator> Jill Side MD ELECTRONICALLY SIGNED 09/04/2011 20:43

## 2014-09-01 NOTE — Consult Note (Signed)
Brief Consult Note: Diagnosis: Altered mental status.   Patient was seen by consultant.   Consult note dictated.   Discussed with Attending MD.   Comments: Appreciate consult for 59 y/o caucasian man with history of cognitive impairment after brain tumor resection 5431yr ago for heme positive stool (3/27) and anemia. Mother is with him in room. Mentation has improved some since admission, has received PRBCs. History of GERD and EGD 20 yrs ago, per  patient who states results were normal. States that it hurts to swallow, but denies all other GI complaints. Did have large black stool yesterday per chart. Is on PPI therapy here, but not at family home.  No history of colonoscopy. Mother does state patient had radiation subsequent to brain tumor resection for a limited time, but does not know if the tumor was cancerous. This was done in [place].   Impression: heme positive stool with odynophagia, history (remote) of GERD. Agree with transfusions, PPi therapy. Will plan for EGD when clinically feasible: awaiting  more of a normalization of the thrombocytopenia and hematolgy work-up ie, hit v. antibiotic effect. Of note, a mild elevation of the AST was appreciated- also noted the need for lactulose; an elevation of the AST may be related to some of his antibiotics or other medications.  Electronic Signatures: Vevelyn PatLondon, Christiane H (NP)  (Signed 28-Mar-13 17:14)  Authored: Brief Consult Note   Last Updated: 28-Mar-13 17:14 by Keturah BarreLondon, Christiane H (NP)

## 2014-09-01 NOTE — Consult Note (Signed)
Chief Complaint:   Subjective/Chief Complaint Denies any new symptoms. Had one BM today, ? dark. patient is on Feso4. Hgb somewhat low. Clinically no signs of active GI bleed.  Recommendations: Agree with further PRBC transfusion. Continue PPI and Nadolol. Will follow.   Electronic Signatures: Lurline DelIftikhar, Martavis Gurney (MD)  (Signed 27-Apr-13 09:20)  Authored: Chief Complaint   Last Updated: 27-Apr-13 09:20 by Lurline DelIftikhar, Mehran Guderian (MD)

## 2014-09-01 NOTE — Consult Note (Signed)
PATIENT NAME:  Duane Young, Duane Young MR#:  [id number] DATE OF BIRTH:  Oct 03, 1955  DATE OF CONSULTATION:  08/05/2011  REFERRING PHYSICIAN:   CONSULTING PHYSICIAN:  Theodore Demark, NP  HISTORY OF PRESENT ILLNESS: Duane Young is a 59 year old Caucasian man with history of cognitive impairment after a brain tumor resection 10 years ago for same. GI has been consulted at the request of Dr. Doy Hutching for heme positive stool on 03/27. His mother is with him in the room. Please see details of his recent admission for altered mental status in the admission history and physical. Mother reports that his mentation has improved since admission. He has also received some packed red blood cells. His hemoglobin yesterday was 6.2. He received 2 units and it increased to 7.4. Evidently Duane Young also had a black, tarry stool yesterday that was heme positive. He does have a remote history of gastroesophageal reflux disease and EGD 20 years ago. Patient states the results were normal. He does report that it hurts to swallow but denies all other gastrointestinal complaints including abdominal pain, acid reflux, heartburn, indigestion, etc. He is on b.i.d. PPI therapy here but has not been at the family care home in which he lives. He has no history of colonoscopy. Mother does state that the patient had radiation subsequent to brain tumor resection for a limited time but does not know if the tumor was cancerous or not. This was done in [place].   PAST MEDICAL HISTORY:  1. Anemia of chronic disease. 2. Chronic constipation. 3. Chronic ataxia. 4. SIADH. 5. Brain tumor, status post resection.  6. Chronic cognitive impairment.   MEDICATIONS:  1. Senna 1 p.o. at bedtime.  2. ASA 81 mg p.o. daily.   ALLERGIES: No known drug allergies.   SOCIAL HISTORY: Lives at [facility] group home. No alcohol, tobacco, or illicits.   FAMILY HISTORY: No history of colorectal cancer, liver disease. Father with peptic ulcer disease.  Family history also significant for diabetes and Parkinson's.    REVIEW OF SYSTEMS: GENERAL: At present he states he is feeling well, just a little sleepy. No complaints of weight loss, chills or fatigue. HEENT: Mother reports that patient's mental status is almost at baseline. He is not having any visual complaints, hearing impairment or mouth sores although he is having some difficulty swallowing due to pain. RESPIRATORY: No history of cough, sputum or shortness of breath. CARDIAC: No history of myocardial infarction, chest pain, palpitations or murmur or edema. GASTROINTESTINAL: As noted. GENITOURINARY: No complaints of dysuria. He does have a Foley in place. It is draining clear yellow urine. MUSCULOSKELETAL: Does have history of ataxia. Has recent history of increasing ataxia and discoordination. No complaints of musculoskeletal pain. INTEGUMENTARY: No erythema, lesion or rash. ENDOCRINE: No history of hypothyroidism or diabetes. Mother states he does get thirsty frequently at times. NEUROLOGIC: History of brain tumor resection, ataxia and SIADH as noted. No recent seizures. Has been lethargic. Mentation is slowly starting to improve. Was found to have an elevated ammonia level that is being treated currently with lactulose b.i.d. PSYCHIATRIC: No complaints of depression or anxiety.   LABORATORY, DIAGNOSTIC AND RADIOLOGICAL DATA: Most recent lab work: Serum glucose 91, BUN 7, creatinine 0.83, sodium 153, potassium 3.2, chloride 123, CO2 19, GFR greater than 60, calcium 7.4, LDH 228, serum protein 6.1, albumin 3.0, total bilirubin 0.4, alkaline phosphatase 37, AST 95, ALT 31. Troponin less than 0.02. TSH 1.3. WBC 5.1, hemoglobin 7.4, hematocrit 22.4, platelets 88, red cells are normocytic  with increased RDW, PT 15.5. INR 1.2. FDP less than 10, fibrinogen 011, ammonia 47, folic acid 20, Y03 496, ESR 37, CRP 16.5 RPR negative. Blood cultures 03/24 without any growth. Haptoglobin and HIT antibodies are pending.  Stool was heme-positive 03/27. CT head without acute ischemic changes or hemorrhage. Does demonstrate some chronic changes in the right frontotemporal region postsurgical. Chest x-ray was without acute abnormality.   PHYSICAL EXAMINATION:  VITAL SIGNS: Most recent vital signs: Temperature 97.7, pulse 66, respiratory rate 20, blood pressure 133/70.   GENERAL: The patient is drowsy but awakens easily, in no acute distress.   PSYCHIATRIC: Cooperative, calm, pleasant, somewhat slow to respond.   HEENT: Normocephalic, atraumatic. Sclerae anicteric. Conjunctiva are pinkish. There are no plaques or lesions in the oropharynx. Mucous membranes are moist and pink.   NECK: No adenopathy, thyromegaly, or JVD.   RESPIRATORY: Respirations eupneic. Lungs CTAB.   CARDIAC: S1, S2, regular rate and rhythm. No MRG. Peripheral pulses 2+. No edema.   ABDOMEN: Nondistended. Active BS x4. Soft, nontender. No hepatosplenomegaly, masses, hernias, peritoneal signs, or other rebound tenderness.   GENITOURINARY: Deferred.   RECTAL: Deferred. Stool has tested heme positive.   SKIN: Warm, pale, dry. No erythema, lesion or rash.   EXTREMITIES: No cyanosis, clubbing, or edema. Strength 4/5 to all extremities.   NEUROLOGICAL: Cranial nerves II through XII grossly intact. Speech clear. No facial droop. Pupils are equal, round and reactive to light and 3 mm.   IMPRESSION AND PLAN: Heme positive stool with odynophagia. Remote history of gastroesophageal reflux disease. Agree with transfusion, PPI therapy. Will plan for EGD when clinically feasible. Appreciate neurologic and hematologic evaluation. Awaiting more of a normalization of thrombocytopenia and hematology workup; i.e. HIT versus antibiotic effect in regards to his platelets. Of note, a mild elevation of the AST was appreciated. Also noted the need for lactulose. An elevation of the AST may also be related to some of his antibiotics, further medications. GI will  follow.   These services were provided by Stephens November, MSN, [facility] in collaboration with Lollie Sails, M.D.  ____________________________ Theodore Demark, NP chl:cms D: 08/05/2011 19:04:31 ET T: 08/06/2011 07:41:37 ET JOB#: [id number] cc: Theodore Demark, NP, <Dictator> [place] SIGNED 08/09/2011 19:46

## 2014-09-01 NOTE — H&P (Signed)
PATIENT NAME:  Duane Young, HOECKER MR#:  [id number] DATE OF BIRTH:  [birth year]  DATE OF ADMISSION:  09/03/2011  REFERRING PHYSICIAN: Dr. Cinda Quest PRIMARY CARE PHYSICIAN: Dr. Doy Hutching  PRESENTING COMPLAINT: Dizziness, presyncope, weakness and increased pallor.   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 Patient presents from group home. Apparently yesterday was seen for a bone marrow biopsy. His mother reports that prior to the procedure he was already developing some weakness but after the procedure his symptoms worsened. He reported some dizziness and presyncope. They were thinking of admitting him for observation yesterday but patient was released home and instead of going to the group home his mother took him home for observation. He progressed to have increased pallor and worsening lightheadedness and dizziness and weakness and she called EMS. He currently denies any chest pain or shortness of breath or palpitations. He feels uncomfortable due to laying in the bed and has some back pain. He has not noticed any hematemesis or bright red blood per rectum. No hematuria. Denies any syncope.   PAST MEDICAL HISTORY:  1. Admitted 03/24 to 08/09/2011 in the setting of metabolic encephalopathy and anemia thought to be related to viral syndrome plus/minus upper GI bleed from esophageal varices.  2. Anemia of chronic disease and pancytopenia.  3. Chronic constipation.  4. Brain tumor status post resection with resultant cognitive impairment and ataxia.  5. 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   PAST SURGICAL HISTORY: As above.   ALLERGIES: No known drug allergies.   MEDICATIONS:  1. Tylenol 650 mg every four hours as needed.  2. Senna 1 tablet  b.i.d.  3. Seroquel 25 mg every morning as needed.  4. Lactulose 30 mL every six hours.  5. Nadolol 20 mg daily.  6. Protonix 40 mg daily.  7. Ferrous sulfate 325 mg b.i.d.   SOCIAL HISTORY: He resides in a group home since his brain tumor resection. No alcohol, tobacco or drug use. He is on disability.   FAMILY HISTORY: Father with Parkinson's disease and brain tumor. He is deceased. On mother's side there is heart disease, diabetes and cancer.    REVIEW OF SYSTEMS: CONSTITUTIONAL: Denies any fevers, nausea, or vomiting. EYES: No changes in vision. ENT: No epistaxis or discharge. RESPIRATORY: No cough, wheezing, hemoptysis. CARDIOVASCULAR: No chest pain, orthopnea, palpitations. Endorses presyncope but no syncope. GASTROINTESTINAL: No nausea or vomiting. He reports constipation. No hematemesis or melena or bright red blood per rectum. GENITOURINARY: No dysuria, hematuria. ENDO: He has increased thirst and drinks large amount of water daily. HEMATOLOGIC: No bleeding. Denies any bruising. SKIN: No ulcers. MUSCULOSKELETAL: He reports back pain from laying in the ED bed. NEUROLOGIC: No history of stroke. Status post brain tumor resection as above. PSYCH: Denies any suicidal ideation.   PHYSICAL EXAMINATION:  VITAL SIGNS: Temperature 98.8, pulse 91, respiratory rate 18, blood pressure 129/63, sating 97% on room air.   GENERAL: Lying in bed, weak appearing, in no apparent distress.   HEENT: Normocephalic, atraumatic. His pupils are slightly dilated but equal and symmetric, nonicteric. He pale conjunctivae with pale skin and pale mucous membrane. He has got moist mucous membrane.    NECK: Soft and supple. No adenopathy or JVP.   CARDIOVASCULAR: Non-tachy. No murmurs, rubs, or gallops.   LUNGS: Clear to  auscultation bilaterally. No use of accessory muscles or increased respiratory effort.   ABDOMEN: Soft. Positive bowel sounds. No mass appreciated.   EXTREMITIES: No edema. Dorsal pedis pulses  intact.   MUSCULOSKELETAL: No joint effusion.   SKIN: No ulcers.   NEUROLOGIC: No dysarthria or aphasia. Symmetrical strength. No focal deficits.   PSYCH: He is alert and oriented. Patient is cooperative.   LABORATORY, DIAGNOSTIC AND RADIOLOGICAL DATA: Urinalysis with specific gravity 1.004, pH 5, RBC less than 1 per high-power field, no WBC. WBC 7, hemoglobin 8.3, hematocrit 24.9, platelets 99, MCV 86, glucose 93, BUN 30, creatinine 0.73, sodium 143, potassium 3.6, chloride 111, carbon dioxide 19, calcium 7.7. LFTs within normal limits. Albumin 3. Troponin less than 0.02. EKG with normal sinus rate of 83. No ST elevation or depression. He has T wave inversion in his lateral leads   ASSESSMENT AND PLAN: Duane Young is a 59 year old gentleman with history of brain tumor status post resection, cognitive impairment and ataxia, diabetes insipidus, pancytopenia, esophageal varices, hepatic steatosis, splenomegaly presenting 24 hours after bone marrow biopsy with presyncope, weakness and increased pallor. 1. Symptomatic anemia. As above, last admitted in March 2013 thought to be related to viral process. Continue to cycle hemoglobin q.6. Transfuse 2 units. Will reconsult hematology oncology. His abdominal ultrasound from 04/25 shows fatty infiltration of the liver and splenomegaly, likely contributing to his pancytopenia. His bone marrow biopsy results are pending. Continue with iron supplementation.  2. Esophageal varices on EGD in March 2013 and based on the ultrasound questionable if he has NAFLD. Will increase his Protonix to b.i.d., restart his nadolol, lactulose. His guaiac was negative on initial evaluation. As above transfuse 2 units.  3. Hypercalcemia. Will send a PTH and calcium ionized level.  4. Prophylaxis with TEDs and SCDs and Protonix.   TIME SPENT: Approximately 50 minutes spent on patient care.   ____________________________ Rita Ohara, MD ap:cms D: 09/03/2011 02:28:33  ET T: 09/03/2011 09:42:01 ET JOB#: [id number]  cc: Brien Few Zsazsa Bahena, MD, <Dictator> Leonie Douglas. Doy Hutching, MD Rita Ohara MD ELECTRONICALLY SIGNED 09/08/2011 22:38

## 2014-09-01 NOTE — Consult Note (Signed)
Chief Complaint:   Subjective/Chief Complaint Feels better. H and H better after transfusion. One BM this morning again reported as dark.   VITAL SIGNS/ANCILLARY NOTES: **Vital Signs.:   28-Apr-13 14:16   Vital Signs Type Q 4hr   Temperature Temperature (F) 97.8   Celsius 36.5   Temperature Source oral   Pulse Pulse 64   Pulse source per Dinamap   Respirations Respirations 18   Systolic BP Systolic BP 412   Diastolic BP (mmHg) Diastolic BP (mmHg) 58   Mean BP 72   BP Source Dinamap   Systolic BP Systolic BP 878   Diastolic BP (mmHg) Diastolic BP (mmHg) 58   Systolic BP Systolic BP 676   Diastolic BP (mmHg) Diastolic BP (mmHg) 57   Systolic BP Systolic BP 720   Diastolic BP (mmHg) Diastolic BP (mmHg) 60   Routine Hem:  28-Apr-13 06:58    WBC (CBC) 2.3   RBC (CBC) 3.22   Hemoglobin (CBC) 9.2   Hematocrit (CBC) 27.0   Platelet Count (CBC) 63   MCV 84   MCH 28.8   MCHC 34.2   RDW 16.3   Neutrophil % 46.8   Lymphocyte % 41.0   Monocyte % 6.3   Eosinophil % 4.8   Basophil % 1.1   Neutrophil # 1.1   Lymphocyte # 0.9   Monocyte # 0.1   Eosinophil # 0.1   Basophil # 0.0  Routine Chem:  28-Apr-13 06:58    Glucose, Serum 77   BUN 7   Creatinine (comp) 0.75   Sodium, Serum 144   Potassium, Serum 3.5   Chloride, Serum 113   CO2, Serum 23   Calcium (Total), Serum 7.9   Osmolality (calc) 284   eGFR (African American) >60   eGFR (Non-African American) >60   Anion Gap 8   Assessment/Plan:  Assessment/Plan:   Assessment Pancytopenia, ? related to chronic liver disease. Bone marrow biopsy reports pending. ? melena vs dak stools secondary to recent iron use. Stool was negative for occult blood on admission. H and H better.    Plan Will continue to observe and review biopsy report as soon as available. If he continues to drop his H and H and/ or have dark stool, will proceed with repeat EGD and colonoscopy.   Electronic Signatures: Jill Side (MD)  (Signed  28-Apr-13 15:24)  Authored: Chief Complaint, VITAL SIGNS/ANCILLARY NOTES, Lab Results, Assessment/Plan   Last Updated: 28-Apr-13 15:24 by Jill Side (MD)

## 2014-09-01 NOTE — H&P (Signed)
PATIENT NAME:  Duane Young, Duane Young MR#:  [id number] DATE OF BIRTH:  1955/05/21  DATE OF ADMISSION:  08/01/2011  REFERRING PHYSICIAN: Glennie IsleSheryl Gottlieb, MD  PRIMARY CARE PHYSICIAN: Aram BeechamJeffrey Sparks, MD  REASON FOR ADMISSION: Altered mental status with lethargy and hypotension.   HISTORY OF PRESENT ILLNESS: The patient is a 59 year old male with a history of cognitive impairment due to previous brain tumor resection. He has chronic constipation. He presents to the Emergency Room with agitation and lethargy. He has not been eating or drinking over the past two days. He is not communicating as much as usual. In the Emergency Room, the patient was found to be hypotensive and tachycardic. White count was 20,000. No obvious source has been noted for his infection thus far. He is now admitted for further evaluation.   PAST MEDICAL HISTORY:  1. Anemia of chronic disease.  2. Chronic constipation.  3. Chronic ataxia. 4. History of SIADH.  5. Status post brain tumor resection.  6. Chronic cognitive impairment.   MEDICATIONS:  1. Senna Plus one p.o. at bedtime.  2. Aspirin 81 mg p.o. daily.   ALLERGIES: No known drug allergies.   SOCIAL HISTORY: Negative for alcohol or tobacco abuse.   FAMILY HISTORY: Unknown.   REVIEW OF SYSTEMS: I am unable to obtain from the patient.   PHYSICAL EXAMINATION:   GENERAL: The patient is lethargic but in no acute distress.   VITAL SIGNS: Vital signs are remarkable for a blood pressure of 104/52 with a heart rate of 112 and a respiratory rate of 20. I am unable to obtain a temperature at this time.   HEENT: Normocephalic, atraumatic. Pupils equally round and reactive to light and accommodation. Extraocular movements are intact. Sclerae are anicteric. Conjunctivae are clear. Oropharynx is dry but clear.   NECK: Supple with no meningeal signs. No adenopathy. No thyromegaly is present.   LUNGS: Clear to auscultation and percussion without wheezes, rales, or rhonchi. No  dullness. Respiratory effort is normal.   CARDIAC:  Rapid rate with a regular rhythm. Normal S1 and S2. No significant rubs, murmurs, or gallops. PMI is nondisplaced. Chest wall is nontender.   ABDOMEN: Soft and nontender with normoactive bowel sounds. No organomegaly or masses were appreciated. No hernias or bruits were noted.   EXTREMITIES: No clubbing, cyanosis, or edema. Pulses were 2+ bilaterally.   SKIN: Warm and dry without rash or lesions.   NEUROLOGIC: Cranial nerves II through XII grossly intact. Deep tendon reflexes are symmetric. Motor and sensory exam is nonfocal.   PSYCH: The patient was lethargic and could not respond verbally to questions.   LABS/STUDIES: Head CT was unremarkable except for chronic changes. Troponin was less than 0.02. White count was 20.0 with a hemoglobin of 8.4. His MCV was 83. Glucose was 157 with a BUN of 63 and a creatinine 1.76 with a sodium of 148 and a GFR of 32.   ASSESSMENT:  1. Metabolic encephalopathy.  2. Presumed SIRS from urinary source.  3. Presumed urinary tract infection.  4. Acute renal failure.  5. Dehydration.  6. Anemia of chronic disease.  7. Chronic constipation.  8. Cognitive impairment due to previous brain surgery.   PLAN: The patient will be admitted to the floor as a FULL CODE. We will send off blood and urine cultures and begin IV fluids with IV antibiotics. We will consult neurology in the morning because of the patient's altered mental status. We will guaiac all stools and send off anemia labs. Follow up routine  labs in the morning. We will consult physical therapy to see if the patient can ambulate tomorrow. We will also consult case management for placement. Further treatment and evaluation will depend upon the patient's progress.   TOTAL TIME SPENT ON THIS PATIENT: 50 minutes.  ____________________________ Duane Lope Judithann Sheen, MD jds:slb D: 08/01/2011 20:11:58 ET T: 08/02/2011 07:10:24 ET JOB#: [id number]  cc: Duane Lope. Judithann Sheen, MD, <Dictator> JEFFREY Rodena Medin MD ELECTRONICALLY SIGNED 08/02/2011 16:48

## 2014-09-01 NOTE — Consult Note (Signed)
PATIENT NAME:  Duane Young, Duane Young MR#:  [id number] DATE OF BIRTH:  1955-09-27  DATE OF CONSULTATION:  08/04/2011  REFERRING PHYSICIAN:  Aram Beecham, MD  CONSULTING PHYSICIAN:  Inetha Maret R. Sherrlyn Hock, MD  REASON FOR CONSULTATION: Recurrent anemia and thrombocytopenia.   HISTORY OF PRESENT ILLNESS: The patient is sleepy and resting, arousable, and talks very briefly. History is obtained from review of the chart record and discussing with the patient's mother who is at bedside.   The patient is a 59 year old gentleman with past medical history of brain tumor resected in [place], [place] (according to his mother it was diagnosed about 10 years ago and had surgery and radiation, unsure of histology but states that he has not had recurrence since) who has been less active both physically and mentally since brain surgery. More recently the patient had worsening weakness and lethargy, agitation, decreased oral intake of 2 to 3 days' duration, and was not communicating as much as usual. The patient was found to be hypotensive and tachycardic in the Emergency Room. White blood count was 20,000. He has had a low-grade fever up to 100.1 upon admission to the hospital. So far blood culture and urine cultures have remained negative. CBC today showed hemoglobin very low at 6.2 with MCV of 86, platelets dropped to 91,000, WBC 7000 with unremarkable differential, creatinine 0.95. Liver functions unremarkable except albumin low at 3. CBC upon admission had shown WBC 20,000, hemoglobin 8.4, platelets 281, MCV 83, creatinine was 1.76. CT of the head without contrast did not show any acute ischemic or hemorrhagic event, no intracranial mass effect or hydrocephalus. Chronic changes in the right frontal and temporal lobes were noted. Overall the patient's condition seems to have improved. Urine and blood cultures remain negative so far. He is currently on ceftriaxone and doxycycline.   The patient's mother denies any knowledge of  the patient having low blood counts in the past, no prior CBCs are available for comparison. She denies him having any recent bleeding issues, recurrent infections, or mouth sores. No obvious bleeding symptoms either.   PAST MEDICAL HISTORY/ PAST SURGICAL HISTORY: As in history of present illness above. In addition, he has a history of: 1. Chronic constipation.  2. Chronic ataxia.  3. History of SIADS. 4. Status post brain tumor resection 10 years ago in [place], [place].  5. Anemia of chronic disease.  6. Chronic cognitive impairment.   SOCIAL HISTORY: No known history of smoking, alcohol, or recreational drug usage.   FAMILY HISTORY: Reportedly has history of brain tumors on his father's side. No hematological disorders.   HOME MEDICATIONS:  1. Aspirin 81 mg daily.  2. Senna  Plus 1 p.o. at bedtime.   ALLERGIES: No known drug allergies.   REVIEW OF SYSTEMS: As in the history of present illness above. Otherwise, the patient is resting but arousable and responds to commands but does not provide history or communicate.   PHYSICAL EXAMINATION:  GENERAL: Well built and nourished individual, resting, no acute distress. No icterus. Pallor present.   VITAL SIGNS:  Temperature 98.3,  pulse 105, respiratory rate 24, blood pressure 105/58, saturation 96% on room air.   HEENT: Normocephalic, atraumatic. Extraocular movements intact. Sclerae anicteric. Mouth is dry.   NECK: No cervical adenopathy.   CARDIOVASCULAR: S1, S2, regular rate and rhythm.   LUNGS: Lungs show bilateral good air entry, no crepitations or rhonchi.   ABDOMEN: Soft. No obvious organomegaly or masses palpable.   EXTREMITIES: No major edema or cyanosis.   SKIN: No  generalized rashes or major bruising.   NEUROLOGIC: The patient does not cooperate for exam.  MUSCULOSKELETAL: No obvious joint deformity or swelling.   LYMPHATICS: No adenopathy in the axillary or inguinal areas.   LABORATORY, DIAGNOSTIC, AND  RADIOLOGICAL DATA: As in history of present illness above.   IMPRESSION AND RECOMMENDATIONS: 59 year old gentleman with prior history of brain tumor resected in [place] about 10 years ago, admitted with worsening lethargy and decreased communicative state, low-grade fevers. The patient has been presumed to have urinary tract infection with metabolic encephalopathy. Neurology evaluation also is ongoing. He also has acute renal failure and dehydration. Creatinine seems to have improved well with current management. Fevers have improved with antibiotic. The patient's mother states that his condition is better since admission. The patient has severe anemia which is normocytic, developing mild thrombocytopenia. Platelet count was normal upon admission. Possible etiology includes ongoing infectious state plus/minus DIC versus heparin-induced thrombocytopenia versus other etiology. Anemia could be secondary to GI blood loss since stool Hemoccult has just returned positive. Recommend gastroenterology evaluation also. So far B12 level is unremarkable. Iron studies show normal TIBC, normal ferritin, slightly low serum iron of 45, and iron saturation slightly low at 13%, which could indicate mild iron deficiency. Agree with supportive transfusions. Will also get further work-up including manual differential, heparin-induced platelet antibody, PT/PTT/FDP/fibrinogen to look for evidence of DIC, LDH, and haptoglobin. Continue to monitor daily CBC. We will continue to follow.   Thank you for the referral. Please feel free to contact me if any additional questions.  ____________________________ Maren ReamerSandeep R. Sherrlyn HockPandit, MD srp:bjt D: 08/04/2011 23:43:42 ET T: 08/05/2011 05:51:42 ET JOB#: [id number]  cc: Darryll CapersSandeep R. Sherrlyn HockPandit, MD, <Dictator> Wille CelesteSANDEEP R Calianna Kim MD ELECTRONICALLY SIGNED 08/05/2011 11:33

## 2014-09-01 NOTE — Consult Note (Signed)
Brief Consult Note: Diagnosis: Anemia.   Patient was seen by consultant.   Consult note dictated.   Comments: Symptomatic anemia with heme negative stools and no signs of active bleeding. Most likely secondary to chronic GI blood loss from portal hypertensive gastropathy. Other lesions such as colonic polyps are also possible. 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 Esophageal varices. No evidence of variceal bleeding.  Recommendations: Agree with PRBC transfusion. Follow H and H  and transfuse if needed. Colonoscopy as OP. Hepatitis serologies to r/o chronic viral hepatitis. Will follow.  Electronic Signatures: Lurline DelIftikhar, Dahlton Hinde (MD)  (Signed 26-Apr-13 17:28)  Authored: Brief Consult Note   Last Updated: 26-Apr-13 17:28 by Lurline DelIftikhar, Inell Mimbs (MD)

## 2014-09-01 NOTE — Discharge Summary (Signed)
PATIENT NAME:  Duane Young, Duane Young MR#:  [id number] DATE OF BIRTH:  28-Aug-1955  DATE OF ADMISSION:  08/01/2011 DATE OF DISCHARGE:  08/09/2011   HISTORY OF PRESENT ILLNESS: The patient is a 59 year old white gentleman who had been a resident at [facility]. The patient was sent to the Emergency Room because of lethargy and agitation. He was also noncommunicative. In the Emergency Room, he was initially found to be hypertensive and tachycardic. He was also noted to have an elevated white count. He was, therefore, admitted for further evaluation.   PAST MEDICAL HISTORY:  1. He had been in a family home ever since he had had a brain tumor resection. He has known chronic cognitive impairment.  2. History of SIADH. 3. Ataxia. 4. Chronic constipation.  5. Anemia of chronic disease.   MEDICATIONS ON ADMISSION:  1. Senna Plus 1 tablet at bedtime.  2. Aspirin 1 daily.   ALLERGIES: No known drug allergies.   REVIEW OF SYSTEMS: Review of systems could not be obtained from the patient on admission.   ADMISSION VITAL SIGNS: Blood pressure 104/52 with a pulse rate of 112. Respiratory rate was 20. Admission exam as described by the admitting physician was most notable for dry mucous membranes. The neck was noted to be supple. The lungs were noted to be clear. There was regular tachycardia but no murmurs or gallops. The abdomen was soft and nontender. Extremities showed no edema or deformity. Neurologic exam was felt to be nonfocal.   LABORATORY, DIAGNOSTIC, AND RADIOLOGICAL DATA: The patient's admission CBC showed a hemoglobin of 8.4 with a hematocrit of 25.2. White count was 20,000. Platelet count was 281,000. Admission comprehensive metabolic panel showed a random blood sugar of 157, BUN of 63, creatinine 1.76, a sodium of 148, a potassium of 3.6, chloride 112, CO2 13, calcium 8.4, alkaline phosphatase of 43, and an estimated GFR of 32. TSH was 1.3. Folic acid level was 20. Serum iron was 45.  Ferritin was 19. Plasma ammonia level was 47. Admission urinalysis was unremarkable. Vitamin B12 was 389. RPR was nonreactive. CRP was 16.25. Subsequent blood and urine cultures showed no growth.   HOSPITAL COURSE: The patient was admitted to the regular floor where he was treated with IV antibiotics for suspected sepsis pending his culture report. It was eventually determined that most likely he had had a viral illness, however. His dehydration was also corrected with IV fluids. His inappropriate ADH was more severe on admission but improved during his hospitalization. He was noted to have a positive Hemoccult. He did eventually drop his hemoglobin to 7.4. He eventually ended up receiving 5 units of packed cells. He was seen in consultation by Gastroenterology who suspected an upper GI bleed. He did undergo upper endoscopy which did not reveal any peptic ulcer disease but did reveal esophageal varices. The patient was placed on nadolol by the consulting gastroenterologist. The patient was also seen during his hospitalization by Hematology/Oncology. He plans to follow the patient as an outpatient for his chronic pancytopenia, although it was improving at the time of discharge. He was placed on an iron supplement twice a day with meals. The patient's final CBC showed a hemoglobin of 8.4 with a hematocrit of 25.3. White count was 3500. Platelet count was 109,000. His final basic metabolic panel was notable for a sodium of 149 and a chloride of 115. It was otherwise relatively unremarkable.   DISCHARGE DIAGNOSES:  1. SIRS secondary to viral syndrome.  2. Probable upper GI  bleed secondary to esophageal varices.  3. Inappropriate ADH notable. 4. Pancytopenia of undetermined etiology.   DISCHARGE MEDICATIONS:  1. Tylenol 650 mg every four hours as needed.  2. Senna 1 tablet b.i.d.  3. Seroquel 25 mg q.a.m. p.r.n.  4. Lactulose 30 mL every six hours.  5. Corgard 20 mg daily.  6. Protonix 40 mg daily.   7. Ferrous sulfate 325 mg twice a day with meals.   DISCHARGE DISPOSITION:  1. The patient is being discharged on a low residue diet with activity as tolerated.  2. He will be re-evaluated by physical therapy.  3. The patient is a FULL CODE and his prognosis at the present time is guarded although he is doing well.   ____________________________ Letta PateJohn B. Danne HarborWalker III, MD jbw:drc D: 08/09/2011 07:13:18 ET T: 08/09/2011 07:43:29 ET JOB#: [id number]  cc: Jonny RuizJohn B. Danne HarborWalker III, MD, <Dictator> Elmo PuttJOHN B WALKER III MD ELECTRONICALLY SIGNED 08/10/2011 7:47

## 2014-09-01 NOTE — Consult Note (Signed)
Pt had varices and gastritis.  Hgb stable, VSS, chest clear, he feels better.  Will advance to full liquids and he could go home today or tomorrow.  Abd soft and non tender.  Electronic Signatures: Scot JunElliott, Wylee Dorantes T (MD)  (Signed on 31-Mar-13 08:52)  Authored  Last Updated: 31-Mar-13 08:52 by Scot JunElliott, Khup Sapia T (MD)

## 2014-09-01 NOTE — Consult Note (Signed)
HEMATOLOGY followup note - still weak but more awake. Denies pain.no fever. Denies obvious bleeding Sxs.resting in bed, NAD. Pallor +          vitals - 97.7, 66, 20, 133/70, 9% on RA          lungs - b/l good air entry          abd - soft, NTWBC 5.1, Hb 7.4, platelets 88K, ANC 2.7, Cr 0.83, INR 1.2.   aPTT, FDP, fibrinogen, LDH, B12, folate unremarkable. Stool OB positive. Serum iron 45, iron saturation 13%.  59 year old gentleman with prior history of brain tumor rese[place] in [place] about 10 years ago, admitted with worsening lethargy and decreased communicative state, low-grade fevers. The patienthasbeen presumed to have urinary tract infection with metabolic encephalopathy. Also had acute renal failure and dehydration, Creatinine has improved. Fevers have improved with antibiotic. Severe anemia with evidence of mild iron deficiency, stool OB is positive. Likely cause for anemia and thrombocytopenia is GI blood loss, GI eval is ongoing. No evidence of DIC or TTP, other possibility for low platelets is heparin-induced thrombocytopenia, continue to avoid heparin compounds till  HIPA assay available. Continue to monitor daily CBC, continue supportive transfusions. Will continue to follow.  Electronic Signatures: Izola PricePandit, Elmire Amrein Raj (MD)  (Signed on 28-Mar-13 23:25)  Authored  Last Updated: 28-Mar-13 23:25 by Izola PricePandit, Javian Nudd Raj (MD)

## 2014-09-01 NOTE — Consult Note (Signed)
HEMATOLOGY followup - sitting in bed alert and conversing. Denies pain.no fever. Denies obvious bleeding Sxs.NAD. Pallor +          vitals - afebrile, stable          lungs - b/l good air entry          abd - soft, NT WBC 3.5, Hb 8.4, platelets 109K, ANC normal, Cr 0.82. Recent INR 1.2.   aPTT, FDP, fibrinogen, LDH, B12, folate unremarkable. Stool OB positive. Serum iron 45, iron saturation 13%.  59 year old gentleman with prior history of brain tumor rese[place] in [place] about 10 years ago, admitted with worsening lethargy and decreased communicative state, low-grade fevers. Mental status has improved back to baseline. Also Cr has improved. Severe anemia with evidence of mild iron deficiency, stool OB is positive. Likely cause for anemia and thrombocytopenia is GI blood loss, EGD shows grade 2 eso varices and gastropathy. Start on oral FeSO4 325 mg PO BID with meals. Regarding thrombocytopenia, platelet count is shwoing good improvement, continue to avoid heparin compounds till  HIPA assay available. If discharged soon will f/u as outpatient for cytopenias. Patient and other present agreeable to this paln.    Electronic Signatures: Izola PricePandit, Kennadee Walthour Raj (MD)  (Signed on 31-Mar-13 22:08)  Authored  Last Updated: 31-Mar-13 22:08 by Izola PricePandit, Jacqulene Huntley Raj (MD)

## 2014-09-01 NOTE — Consult Note (Signed)
Pt with black stool, anemia, hgb still mid 7 range despite 4 units of blood since admission.  Chest clear, heart RRR, abd mild tenderness in RUQ.  VSS afebrile, hgb 7.5, plt 101, PT 15 .5 sec.  I discussed plans with patient, mom, and nurse.  Plan to transfuse one more unit of blood, risks and benefits explained to patient and mother. Plan to do EGD tomorrow with anesthesia for all the above indications.    Electronic Signatures: Scot JunElliott, Robert T (MD)  (Signed on 29-Mar-13 10:16)  Authored  Last Updated: 29-Mar-13 10:16 by Scot JunElliott, Robert T (MD)

## 2014-09-01 NOTE — Consult Note (Signed)
Pt EGD shows esophageal varices in lower esophagus, no signs of recent bleeding.  No fibrin plugs, no red spots, no blood.  Stomach shows spots of portal hypertensive gastropathy.  Will start nadolol at low dose of 20 mg and follow heart rate and BP and increase to 40mg  a day if tolerates the initial dose of 20.  Will hold on scheduling colonoscopy at this time.  Electronic Signatures: Scot JunElliott, Robert T (MD)  (Signed on 30-Mar-13 08:49)  Authored  Last Updated: 30-Mar-13 08:49 by Scot JunElliott, Robert T (MD)

## 2014-09-01 NOTE — Consult Note (Signed)
Chief Complaint:   Subjective/Chief Complaint Overall feels well. H and H stable. Will discuss with Dr. Sherrlyn HockPandit. Patient will need a colonoscopy at some point either on Wednesday as IP or OP. Will make this determination after discussing with Dr. Sherrlyn HockPandit.   Electronic Signatures: Lurline DelIftikhar, Kiaraliz Rafuse (MD)  (Signed 29-Apr-13 19:07)  Authored: Chief Complaint   Last Updated: 29-Apr-13 19:07 by Lurline DelIftikhar, Lam Mccubbins (MD)

## 2014-09-01 NOTE — Discharge Summary (Signed)
PATIENT NAME:  Duane Young, Duane Young MR#:  [id number] DATE OF BIRTH:  10-25-1955  DATE OF ADMISSION:  09/03/2011 DATE OF DISCHARGE:  09/07/2011  REASON FOR ADMISSION: Symptomatic anemia.   HISTORY OF PRESENT ILLNESS: Please see the dictated history of present illness done by Dr. Inez Catalina on 09/03/2011.   PAST MEDICAL HISTORY:  1. Chronic anemia.  2. Pancytopenia.  3. History of metabolic encephalopathy.  4. Chronic GI bleeding due to esophageal varices.  5. SIADH.  6. History of brain tumor resection with resultant cognitive impairment and chronic ataxia.   MEDICATIONS ON ADMISSION: Please see admission note.   ALLERGIES: No known drug allergies.   SOCIAL HISTORY, FAMILY HISTORY, AND REVIEW OF SYSTEMS: As per admission note.   PHYSICAL EXAMINATION: The patient is in no acute distress. Vital signs were stable and he was afebrile. HEENT exam was unremarkable. NECK was supple without JVD. LUNGS were clear. CARDIAC exam revealed a regular rate and rhythm with normal S1, S2. ABDOMEN soft and nontender. EXTREMITIES were without edema. NEUROLOGIC exam was grossly nonfocal.   LABORATORY DATA: Laboratory data revealed a hemoglobin of 8.3.   HOSPITAL COURSE: The patient was admitted with symptomatic anemia and transfused 2 units of packed red blood cells. He was seen in consultation by Oncology and GI who recommended conservative therapy at this time. He had a bone marrow biopsy the day prior to his admission. The results are still pending. He was maintained on iron supplementation. After his transfusion, the patient remained stable. He was eating well. His hemoglobin remained stable posttransfusion without evidence of active bleeding. By 09/07/2011, the patient was stable and ready for discharge.   DISCHARGE DIAGNOSES:  1. Symptomatic anemia of unclear etiology.  2. Chronic thrombocytopenia.  3. Splenomegaly.  4. Esophageal varices.  5. Hypercalcemia.  6. 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  7. Chronic ataxia.  8. SIADH.   DISCHARGE MEDICATIONS:  1. Senna one p.o. b.i.d.  2. Seroquel 25 mg p.o. daily.  3. Nadolol 20 mg p.o. daily.  4. Protonix 40 mg p.o. daily.  5. Iron sulfate 325 mg p.o. b.i.d.  6. Lactulose 30 mL q.6 hours p.r.n.   FOLLOW-UP PLANS AND APPOINTMENTS:  1. The patient was discharged on a regular diet.  2. He will follow-up with Hematology and myself within one week's time period, sooner if needed.   ____________________________ Leonie Douglas Doy Hutching, MD jds:drc D: 09/12/2011 13:15:58 ET T: 09/12/2011 13:45:17 ET JOB#: [id number]  cc: Leonie Douglas. Doy Hutching, MD, <Dictator> JEFFREY Lennice Sites MD ELECTRONICALLY SIGNED 09/12/2011 15:01

## 2014-10-21 ENCOUNTER — Encounter: Payer: Self-pay | Admitting: Internal Medicine

## 2014-12-19 ENCOUNTER — Other Ambulatory Visit: Payer: Self-pay | Admitting: Nurse Practitioner

## 2014-12-19 DIAGNOSIS — K7030 Alcoholic cirrhosis of liver without ascites: Secondary | ICD-10-CM

## 2014-12-25 ENCOUNTER — Ambulatory Visit
Admission: RE | Admit: 2014-12-25 | Discharge: 2014-12-25 | Disposition: A | Discharge status: Home / Self Care | Payer: Medicare Other | Source: Ambulatory Visit | Attending: Nurse Practitioner | Admitting: Nurse Practitioner

## 2014-12-25 DIAGNOSIS — R161 Splenomegaly, not elsewhere classified: Secondary | ICD-10-CM | POA: Insufficient documentation

## 2014-12-25 DIAGNOSIS — R160 Hepatomegaly, not elsewhere classified: Secondary | ICD-10-CM | POA: Insufficient documentation

## 2014-12-25 DIAGNOSIS — K7030 Alcoholic cirrhosis of liver without ascites: Secondary | ICD-10-CM | POA: Diagnosis present

## 2015-06-03 ENCOUNTER — Other Ambulatory Visit: Payer: Self-pay | Admitting: Nurse Practitioner

## 2015-06-03 DIAGNOSIS — K7030 Alcoholic cirrhosis of liver without ascites: Secondary | ICD-10-CM

## 2015-06-12 ENCOUNTER — Ambulatory Visit
Admission: RE | Admit: 2015-06-12 | Discharge: 2015-06-12 | Disposition: A | Discharge status: Home / Self Care | Payer: Medicare Other | Source: Ambulatory Visit | Attending: Nurse Practitioner | Admitting: Nurse Practitioner

## 2019-06-18 DIAGNOSIS — K5900 Constipation, unspecified: Secondary | ICD-10-CM

## 2019-06-19 ENCOUNTER — Inpatient Hospital Stay
Admit: 2019-06-19 | Discharge: 2019-06-19 | Disposition: A | Discharge status: Home / Self Care | Payer: MEDICARE | Attending: Student in an Organized Health Care Education/Training Program

## 2019-06-19 ENCOUNTER — Inpatient Hospital Stay: Admit: 2019-06-19 | Attending: Student in an Organized Health Care Education/Training Program

## 2019-06-19 MED ORDER — POLYETHYLENE GLYCOL 3350 100 % ORAL POWDER
17 gram/dose | Freq: Every day | ORAL | 0 refills | Status: AC
Start: 2019-06-19 — End: 2019-06-22

## 2019-06-19 NOTE — ED Provider Notes (Signed)
Patient is a 64 year old male presented emergency department for constipation.  Patient says that he did not have a full bowel movement yesterday having abdominal distention and some mild pain.  Patient denies any fevers, chills, body aches, nausea or vomiting.           No past medical history on file.    No past surgical history on file.      No family history on file.    Social History     Socioeconomic History   ??? Marital status: SINGLE     Spouse name: Not on file   ??? Number of children: Not on file   ??? Years of education: Not on file   ??? Highest education level: Not on file   Occupational History   ??? Not on file   Social Needs   ??? Financial resource strain: Not on file   ??? Food insecurity     Worry: Not on file     Inability: Not on file   ??? Transportation needs     Medical: Not on file     Non-medical: Not on file   Tobacco Use   ??? Smoking status: Not on file   Substance and Sexual Activity   ??? Alcohol use: Not on file   ??? Drug use: Not on file   ??? Sexual activity: Not on file   Lifestyle   ??? Physical activity     Days per week: Not on file     Minutes per session: Not on file   ??? Stress: Not on file   Relationships   ??? Social Product manager on phone: Not on file     Gets together: Not on file     Attends religious service: Not on file     Active member of club or organization: Not on file     Attends meetings of clubs or organizations: Not on file     Relationship status: Not on file   ??? Intimate partner violence     Fear of current or ex partner: Not on file     Emotionally abused: Not on file     Physically abused: Not on file     Forced sexual activity: Not on file   Other Topics Concern   ??? Not on file   Social History Narrative   ??? Not on file         ALLERGIES: Patient has no known allergies.    Review of Systems   Gastrointestinal: Positive for abdominal pain and constipation. Negative for nausea and vomiting.   All other systems reviewed and are negative.      Vitals:    06/19/19 0011    BP: 123/70   Pulse: 62   Resp: 20   Temp: 97.1 ??F (36.2 ??C)   SpO2: 97%   Weight: 95.3 kg (210 lb)   Height: 5\' 11"  (1.803 m)            Physical Exam  Vitals signs and nursing note reviewed.   Constitutional:       Appearance: Normal appearance.   Eyes:      Extraocular Movements: Extraocular movements intact.      Pupils: Pupils are equal, round, and reactive to light.   Cardiovascular:      Rate and Rhythm: Normal rate and regular rhythm.   Pulmonary:      Effort: Pulmonary effort is normal.      Breath sounds: Normal  breath sounds.   Abdominal:      General: There is distension.      Tenderness: There is generalized abdominal tenderness.   Musculoskeletal: Normal range of motion.   Neurological:      General: No focal deficit present.      Mental Status: He is alert and oriented to person, place, and time.   Psychiatric:         Mood and Affect: Mood normal.         Behavior: Behavior normal.          MDM  Number of Diagnoses or Management Options  Constipation, unspecified constipation type  Diagnosis management comments: A/P: Constipation.  64 year old male presenting with likely constipation verified with x-ray.  No evidence of obstruction patient otherwise well-appearing.  Will start patient on MiraLAX regiment.       Amount and/or Complexity of Data Reviewed  Tests in the radiology section of CPT??: ordered and reviewed    Risk of Complications, Morbidity, and/or Mortality  Presenting problems: moderate  Diagnostic procedures: moderate  Management options: moderate    Patient Progress  Patient progress: stable         Procedures

## 2019-06-19 NOTE — ED Provider Notes (Signed)
Patient is a 64 year old male presented emergency department for constipation.  Patient says that he did not have a full bowel movement yesterday having abdominal distention and some mild pain.  Patient denies any fevers, chills, body aches, nausea or vomiting.           No past medical history on file.    No past surgical history on file.      No family history on file.    Social History     Socioeconomic History   ??? Marital status: SINGLE     Spouse name: Not on file   ??? Number of children: Not on file   ??? Years of education: Not on file   ??? Highest education level: Not on file   Occupational History   ??? Not on file   Social Needs   ??? Financial resource strain: Not on file   ??? Food insecurity     Worry: Not on file     Inability: Not on file   ??? Transportation needs     Medical: Not on file     Non-medical: Not on file   Tobacco Use   ??? Smoking status: Not on file   Substance and Sexual Activity   ??? Alcohol use: Not on file   ??? Drug use: Not on file   ??? Sexual activity: Not on file   Lifestyle   ??? Physical activity     Days per week: Not on file     Minutes per session: Not on file   ??? Stress: Not on file   Relationships   ??? Social Wellsite geologist on phone: Not on file     Gets together: Not on file     Attends religious service: Not on file     Active member of club or organization: Not on file     Attends meetings of clubs or organizations: Not on file     Relationship status: Not on file   ??? Intimate partner violence     Fear of current or ex partner: Not on file     Emotionally abused: Not on file     Physically abused: Not on file     Forced sexual activity: Not on file   Other Topics Concern   ??? Not on file   Social History Narrative   ??? Not on file         ALLERGIES: Patient has no known allergies.    Review of Systems   Gastrointestinal: Positive for abdominal pain and constipation. Negative for nausea and vomiting.   All other systems reviewed and are negative.      Vitals:    06/19/19 0011   BP:  123/70   Pulse: 62   Resp: 20   Temp: 97.1 ??F (36.2 ??C)   SpO2: 97%   Weight: 95.3 kg (210 lb)   Height: 5\' 11"  (1.803 m)            Physical Exam  Vitals signs and nursing note reviewed.   Constitutional:       Appearance: Normal appearance.   Eyes:      Extraocular Movements: Extraocular movements intact.      Pupils: Pupils are equal, round, and reactive to light.   Cardiovascular:      Rate and Rhythm: Normal rate and regular rhythm.   Pulmonary:      Effort: Pulmonary effort is normal.      Breath sounds: Normal  breath sounds.   Abdominal:      General: There is distension.      Tenderness: There is generalized abdominal tenderness.   Musculoskeletal: Normal range of motion.   Neurological:      General: No focal deficit present.      Mental Status: He is alert and oriented to person, place, and time.   Psychiatric:         Mood and Affect: Mood normal.         Behavior: Behavior normal.          MDM  Number of Diagnoses or Management Options  Constipation, unspecified constipation type  Diagnosis management comments: A/P: Constipation.  64 year old male presenting with likely constipation verified with x-ray.  No evidence of obstruction patient otherwise well-appearing.  Will start patient on MiraLAX regiment.       Amount and/or Complexity of Data Reviewed  Tests in the radiology section of CPT??: ordered and reviewed    Risk of Complications, Morbidity, and/or Mortality  Presenting problems: moderate  Diagnostic procedures: moderate  Management options: moderate    Patient Progress  Patient progress: stable         Procedures

## 2019-06-19 NOTE — ED Notes (Signed)
Patient arrives to ED with complaints of lower abdominal pain and constipation

## 2019-06-19 NOTE — ED Triage Notes (Signed)
Patient arrives to ED with complaints of lower abdominal pain and constipation
